# Patient Record
Sex: Male | Born: 1955 | Race: White | Hispanic: No
Health system: Northeastern US, Academic
[De-identification: ages and names within clinical notes are randomized; demographics above are authoritative.]

---

## 2020-01-01 MED ORDER — MUPIROCIN 2 % TOPICAL OINTMENT
2 | Freq: Three times a day (TID) | TOPICAL | 3 refills | 7.00000 days | Status: AC
Start: 2020-01-01 — End: 2020-01-22

## 2020-01-09 ENCOUNTER — Inpatient Hospital Stay
Admit: 2020-01-09 | Discharge: 2020-01-22 | Payer: PRIVATE HEALTH INSURANCE | Source: Ambulatory Visit | Admitting: Orthopedic Surgery

## 2020-01-10 LAB — CBC WITH AUTO DIFFERENTIAL
BKR WAM ABSOLUTE IMMATURE GRANULOCYTES: 0 x 1000/ÂµL (ref 0.0–0.3)
BKR WAM ABSOLUTE LYMPHOCYTE COUNT: 1.3 x 1000/ÂµL (ref 1.0–4.0)
BKR WAM ABSOLUTE NRBC: 0 x 1000/ÂµL (ref 0.0–0.0)
BKR WAM ANALYZER ANC: 5 x 1000/ÂµL (ref 1.0–11.0)
BKR WAM BASOPHIL ABSOLUTE COUNT: 0 x 1000/ÂµL (ref 0.0–0.0)
BKR WAM BASOPHILS: 0.4 % (ref 0.0–4.0)
BKR WAM EOSINOPHIL ABSOLUTE COUNT: 0.1 x 1000/ÂµL (ref 0.0–1.0)
BKR WAM EOSINOPHILS: 1.8 % (ref 0.0–7.0)
BKR WAM HEMATOCRIT: 33.1 % — ABNORMAL LOW (ref 37.0–52.0)
BKR WAM HEMOGLOBIN: 10.5 g/dL — ABNORMAL LOW (ref 12.0–18.0)
BKR WAM IMMATURE GRANULOCYTES: 0.1 % (ref 0.0–3.0)
BKR WAM LYMPHOCYTES: 18.6 % (ref 8.0–49.0)
BKR WAM MCH (PG): 31.1 pg — ABNORMAL HIGH (ref 27.0–31.0)
BKR WAM MCHC: 31.7 g/dL (ref 31.0–36.0)
BKR WAM MCV: 97.9 fL — ABNORMAL HIGH (ref 78.0–94.0)
BKR WAM MONOCYTE ABSOLUTE COUNT: 0.6 x 1000/ÂµL (ref 0.0–2.0)
BKR WAM MONOCYTES: 8.5 % (ref 4.0–15.0)
BKR WAM MPV: 9.5 fL (ref 6.0–11.0)
BKR WAM NEUTROPHILS: 70.6 % (ref 37.0–84.0)
BKR WAM NUCLEATED RED BLOOD CELLS: 0 % (ref 0.0–1.0)
BKR WAM PLATELETS: 156 x1000/ÂµL (ref 140–440)
BKR WAM RDW-CV: 13.2 % (ref 11.5–14.5)
BKR WAM RED BLOOD CELL COUNT: 3.4 M/ÂµL — ABNORMAL LOW (ref 3.8–5.9)
BKR WAM WHITE BLOOD CELL COUNT: 7.1 x1000/ÂµL (ref 4.0–10.0)

## 2020-01-10 LAB — BASIC METABOLIC PANEL
BKR ANION GAP: 7 (ref 7–17)
BKR BLOOD UREA NITROGEN: 25 mg/dL — ABNORMAL HIGH (ref 8–23)
BKR BUN / CREAT RATIO: 22.7 (ref 8.0–23.0)
BKR CALCIUM: 8.3 mg/dL — ABNORMAL LOW (ref 8.8–10.2)
BKR CHLORIDE: 105 mmol/L (ref 98–107)
BKR CO2: 25 mmol/L (ref 20–30)
BKR CREATININE: 1.1 mg/dL (ref 0.40–1.30)
BKR EGFR (AFR AMER): >60 mL/min/{1.73_m2} (ref >60)
BKR EGFR (NON AFRICAN AMERICAN): >60 mL/min/{1.73_m2} (ref >60)
BKR GLUCOSE: 105 mg/dL — ABNORMAL HIGH (ref 70–100)
BKR POTASSIUM: 4.8 mmol/L (ref 3.3–5.1)
BKR SODIUM: 137 mmol/L (ref 136–144)

## 2020-01-11 LAB — CBC WITH AUTO DIFFERENTIAL
BKR WAM ABSOLUTE IMMATURE GRANULOCYTES: 0 x 1000/ÂµL (ref 0.0–0.3)
BKR WAM ABSOLUTE LYMPHOCYTE COUNT: 1.3 x 1000/ÂµL (ref 1.0–4.0)
BKR WAM ABSOLUTE NRBC: 0 x 1000/ÂµL (ref 0.0–0.0)
BKR WAM ANALYZER ANC: 4.8 x 1000/ÂµL (ref 1.0–11.0)
BKR WAM BASOPHIL ABSOLUTE COUNT: 0 x 1000/ÂµL (ref 0.0–0.0)
BKR WAM BASOPHILS: 0.4 % (ref 0.0–4.0)
BKR WAM EOSINOPHIL ABSOLUTE COUNT: 0.1 x 1000/ÂµL (ref 0.0–1.0)
BKR WAM EOSINOPHILS: 1.5 % (ref 0.0–7.0)
BKR WAM HEMATOCRIT: 35.3 % — ABNORMAL LOW (ref 37.0–52.0)
BKR WAM HEMOGLOBIN: 11.2 g/dL — ABNORMAL LOW (ref 12.0–18.0)
BKR WAM IMMATURE GRANULOCYTES: 0.1 % (ref 0.0–3.0)
BKR WAM LYMPHOCYTES: 19.1 % (ref 8.0–49.0)
BKR WAM MCH (PG): 30.6 pg (ref 27.0–31.0)
BKR WAM MCHC: 31.7 g/dL (ref 31.0–36.0)
BKR WAM MCV: 96.4 fL — ABNORMAL HIGH (ref 78.0–94.0)
BKR WAM MONOCYTE ABSOLUTE COUNT: 0.6 x 1000/ÂµL (ref 0.0–2.0)
BKR WAM MONOCYTES: 8.8 % (ref 4.0–15.0)
BKR WAM MPV: 9.4 fL (ref 6.0–11.0)
BKR WAM NEUTROPHILS: 70.1 % (ref 37.0–84.0)
BKR WAM NUCLEATED RED BLOOD CELLS: 0 % (ref 0.0–1.0)
BKR WAM PLATELETS: 156 x1000/ÂµL (ref 140–440)
BKR WAM RDW-CV: 13.1 % (ref 11.5–14.5)
BKR WAM RED BLOOD CELL COUNT: 3.7 M/ÂµL — ABNORMAL LOW (ref 3.8–5.9)
BKR WAM WHITE BLOOD CELL COUNT: 6.8 x1000/ÂµL (ref 4.0–10.0)

## 2020-01-11 LAB — URINE DRUG SCREEN W/ NO CONF   (BH GH LMH YH)
BKR AMPHETAMINE SCREEN, URINE, NO CONF.: NEGATIVE
BKR BARBITURATE SCREEN, URINE, NO CONF.: NEGATIVE
BKR BENZODIAZEPINES SCREEN, URINE, NO CONF.: NEGATIVE
BKR CANNABINOIDS SCREEN, URINE, NO CONF.: NEGATIVE
BKR COCAINE SCREEN, URINE, NO CONF.: NEGATIVE
BKR METHADONE METABOLITE SCREEN, URINE, NO CONF.: POSITIVE — AB
BKR OPIATES SCREEN, URINE, NO CONF.: NEGATIVE
BKR OXYCODONE SCREEN, URINE, NO CONF.: POSITIVE — AB
BKR PHENCYCLIDINE (PCP) SCREEN, URINE, NO CONF.: NEGATIVE

## 2020-01-11 LAB — BASIC METABOLIC PANEL
BKR ANION GAP: 10 (ref 7–17)
BKR ANION GAP: 10 (ref 7–17)
BKR BLOOD UREA NITROGEN: 12 mg/dL (ref 8–23)
BKR BLOOD UREA NITROGEN: 16 mg/dL (ref 8–23)
BKR BUN / CREAT RATIO: 13.3 (ref 8.0–23.0)
BKR BUN / CREAT RATIO: 20 (ref 8.0–23.0)
BKR CALCIUM: 9.1 mg/dL (ref 8.8–10.2)
BKR CALCIUM: 9.1 mg/dL (ref 8.8–10.2)
BKR CHLORIDE: 98 mmol/L (ref 98–107)
BKR CHLORIDE: 98 mmol/L (ref 98–107)
BKR CO2: 27 mmol/L (ref 20–30)
BKR CO2: 27 mmol/L (ref 20–30)
BKR CREATININE: 0.9 mg/dL (ref 0.40–1.30)
BKR CREATININE: 0.8 mg/dL (ref 0.40–1.30)
BKR EGFR (AFR AMER): >60 mL/min/{1.73_m2} (ref >60)
BKR EGFR (AFR AMER): >60 mL/min/{1.73_m2} (ref >60)
BKR EGFR (NON AFRICAN AMERICAN): >60 mL/min/{1.73_m2} (ref >60)
BKR EGFR (NON AFRICAN AMERICAN): >60 mL/min/{1.73_m2} (ref >60)
BKR GLUCOSE: 92 mg/dL (ref 70–100)
BKR GLUCOSE: 101 mg/dL — ABNORMAL HIGH (ref 70–100)
BKR POTASSIUM: 4.3 mmol/L (ref 3.3–5.1)
BKR POTASSIUM: 4.2 mmol/L (ref 3.3–5.1)
BKR SODIUM: 135 mmol/L — ABNORMAL LOW (ref 136–144)
BKR SODIUM: 135 mmol/L — ABNORMAL LOW (ref 136–144)

## 2020-01-11 LAB — PARTIAL THROMBOPLASTIN TIME     (BH GH LMW Q YH): BKR PARTIAL THROMBOPLASTIN TIME: 24.2 s (ref 22.5–32.0)

## 2020-01-11 LAB — CBC WITHOUT DIFFERENTIAL
BKR WAM ANALYZER ANC: 5.1 x 1000/ÂµL (ref 1.0–11.0)
BKR WAM HEMATOCRIT: 34.2 % — ABNORMAL LOW (ref 37.0–52.0)
BKR WAM HEMOGLOBIN: 11.1 g/dL — ABNORMAL LOW (ref 12.0–18.0)
BKR WAM MCH (PG): 31 pg (ref 27.0–31.0)
BKR WAM MCHC: 32.5 g/dL (ref 31.0–36.0)
BKR WAM MCV: 95.5 fL — ABNORMAL HIGH (ref 78.0–94.0)
BKR WAM MPV: 9.4 fL (ref 6.0–11.0)
BKR WAM PLATELETS: 154 x1000/ÂµL (ref 140–440)
BKR WAM RDW-CV: 13 % (ref 11.5–14.5)
BKR WAM RED BLOOD CELL COUNT: 3.6 M/ÂµL — ABNORMAL LOW (ref 3.8–5.9)
BKR WAM WHITE BLOOD CELL COUNT: 6.6 x1000/ÂµL (ref 4.0–10.0)

## 2020-01-11 LAB — FIBRINOGEN     (BH GH L LMW YH): BKR FIBRINOGEN LEVEL: 508 mg/dL — ABNORMAL HIGH (ref 209–444)

## 2020-01-11 LAB — PROTIME AND INR
BKR INR: 0.95 (ref 0.90–1.09)
BKR PROTHROMBIN TIME: 10.6 s (ref 10.2–12.2)

## 2020-01-11 LAB — ZZZTROPONIN T     (Q)
BKR TROPONIN T: <0.01 ng/mL (ref <0.01)
BKR TROPONIN T: <0.01 ng/mL (ref <0.01)

## 2020-01-12 LAB — COMPREHENSIVE METABOLIC PANEL
BKR A/G RATIO: 1.3 (ref 1.0–2.2)
BKR ALANINE AMINOTRANSFERASE (ALT): 18 U/L (ref 9–59)
BKR ALBUMIN: 3.8 g/dL (ref 3.6–4.9)
BKR ALKALINE PHOSPHATASE: 86 U/L (ref 9–122)
BKR ANION GAP: 11 (ref 7–17)
BKR ASPARTATE AMINOTRANSFERASE (AST): 21 U/L (ref 10–35)
BKR AST/ALT RATIO: 1.2 (ref 0.3–4.9)
BKR BILIRUBIN TOTAL: 0.6 mg/dL (ref <=1.2)
BKR BLOOD UREA NITROGEN: 17 mg/dL (ref 8–23)
BKR BUN / CREAT RATIO: 21.5 (ref 8.0–23.0)
BKR CALCIUM: 9.2 mg/dL (ref 8.8–10.2)
BKR CHLORIDE: 101 mmol/L (ref 98–107)
BKR CO2: 25 mmol/L (ref 20–30)
BKR CREATININE: 0.79 mg/dL (ref 0.40–1.30)
BKR EGFR (AFR AMER): >60 mL/min/{1.73_m2} (ref >60)
BKR EGFR (NON AFRICAN AMERICAN): >60 mL/min/{1.73_m2} (ref >60)
BKR GLOBULIN: 2.9 g/dL (ref 2.3–3.5)
BKR GLUCOSE: 105 mg/dL — ABNORMAL HIGH (ref 70–100)
BKR POTASSIUM: 4.2 mmol/L (ref 3.3–5.1)
BKR PROTEIN TOTAL: 6.7 g/dL (ref 6.6–8.7)
BKR SODIUM: 137 mmol/L (ref 136–144)

## 2020-01-12 LAB — CBC WITH AUTO DIFFERENTIAL
BKR WAM ABSOLUTE IMMATURE GRANULOCYTES: 0 x 1000/ÂµL (ref 0.0–0.3)
BKR WAM ABSOLUTE LYMPHOCYTE COUNT: 1.3 x 1000/ÂµL (ref 1.0–4.0)
BKR WAM ABSOLUTE NRBC: 0 x 1000/ÂµL (ref 0.0–0.0)
BKR WAM ANALYZER ANC: 5.4 x 1000/ÂµL (ref 1.0–11.0)
BKR WAM BASOPHIL ABSOLUTE COUNT: 0 x 1000/ÂµL (ref 0.0–0.0)
BKR WAM BASOPHILS: 0.3 % (ref 0.0–4.0)
BKR WAM EOSINOPHIL ABSOLUTE COUNT: 0.1 x 1000/ÂµL (ref 0.0–1.0)
BKR WAM EOSINOPHILS: 1.5 % (ref 0.0–7.0)
BKR WAM HEMATOCRIT: 34 % — ABNORMAL LOW (ref 37.0–52.0)
BKR WAM HEMOGLOBIN: 11 g/dL — ABNORMAL LOW (ref 12.0–18.0)
BKR WAM IMMATURE GRANULOCYTES: 0.3 % (ref 0.0–3.0)
BKR WAM LYMPHOCYTES: 17.4 % (ref 8.0–49.0)
BKR WAM MCH (PG): 30.9 pg (ref 27.0–31.0)
BKR WAM MCHC: 32.4 g/dL (ref 31.0–36.0)
BKR WAM MCV: 95.5 fL — ABNORMAL HIGH (ref 78.0–94.0)
BKR WAM MONOCYTE ABSOLUTE COUNT: 0.6 x 1000/ÂµL (ref 0.0–2.0)
BKR WAM MONOCYTES: 7.7 % (ref 4.0–15.0)
BKR WAM MPV: 9.8 fL (ref 6.0–11.0)
BKR WAM NEUTROPHILS: 72.8 % (ref 37.0–84.0)
BKR WAM NUCLEATED RED BLOOD CELLS: 0 % (ref 0.0–1.0)
BKR WAM PLATELETS: 162 x1000/ÂµL (ref 140–440)
BKR WAM RDW-CV: 12.7 % (ref 11.5–14.5)
BKR WAM RED BLOOD CELL COUNT: 3.6 M/ÂµL — ABNORMAL LOW (ref 3.8–5.9)
BKR WAM WHITE BLOOD CELL COUNT: 7.4 x1000/ÂµL (ref 4.0–10.0)

## 2020-01-12 LAB — LDL CHOLESTEROL, DIRECT: BKR LDL CHOLESTEROL DIRECT: 80 mg/dL

## 2020-01-12 LAB — LIPID PANEL
BKR CHOLESTEROL/HDL RATIO: 2.3 (ref 0.0–5.0)
BKR CHOLESTEROL: 165 mg/dL
BKR HDL CHOLESTEROL: 71 mg/dL (ref >=40)
BKR LDL CHOLESTEROL CALCULATED: 72 mg/dL
BKR TRIGLYCERIDES: 112 mg/dL

## 2020-01-12 LAB — T4, FREE: BKR FREE T4: 1.49 ng/dL

## 2020-01-12 LAB — PHOSPHORUS     (BH GH L LMW YH): BKR PHOSPHORUS: 4.1 mg/dL (ref 2.2–4.5)

## 2020-01-12 LAB — MAGNESIUM: BKR MAGNESIUM: 2.2 mg/dL (ref 1.7–2.4)

## 2020-01-12 LAB — TSH W/REFLEX TO FT4     (BH GH LMW Q YH): BKR THYROID STIMULATING HORMONE: 0.136 u[IU]/mL — ABNORMAL LOW

## 2020-01-13 LAB — CBC WITH AUTO DIFFERENTIAL
BKR WAM ABSOLUTE IMMATURE GRANULOCYTES: 0 x 1000/ÂµL (ref 0.0–0.3)
BKR WAM ABSOLUTE LYMPHOCYTE COUNT: 1.1 x 1000/ÂµL (ref 1.0–4.0)
BKR WAM ABSOLUTE NRBC: 0 x 1000/ÂµL (ref 0.0–0.0)
BKR WAM ANALYZER ANC: 5 x 1000/ÂµL (ref 1.0–11.0)
BKR WAM BASOPHIL ABSOLUTE COUNT: 0 x 1000/ÂµL (ref 0.0–0.0)
BKR WAM BASOPHILS: 0.4 % (ref 0.0–4.0)
BKR WAM EOSINOPHIL ABSOLUTE COUNT: 0.2 x 1000/ÂµL (ref 0.0–1.0)
BKR WAM EOSINOPHILS: 2.6 % (ref 0.0–7.0)
BKR WAM HEMATOCRIT: 33.5 % — ABNORMAL LOW (ref 37.0–52.0)
BKR WAM HEMOGLOBIN: 10.9 g/dL — ABNORMAL LOW (ref 12.0–18.0)
BKR WAM IMMATURE GRANULOCYTES: 0.3 % (ref 0.0–3.0)
BKR WAM LYMPHOCYTES: 16 % (ref 8.0–49.0)
BKR WAM MCH (PG): 31.2 pg — ABNORMAL HIGH (ref 27.0–31.0)
BKR WAM MCHC: 32.5 g/dL (ref 31.0–36.0)
BKR WAM MCV: 96 fL — ABNORMAL HIGH (ref 78.0–94.0)
BKR WAM MONOCYTE ABSOLUTE COUNT: 0.5 x 1000/ÂµL (ref 0.0–2.0)
BKR WAM MONOCYTES: 7.8 % (ref 4.0–15.0)
BKR WAM MPV: 10 fL (ref 6.0–11.0)
BKR WAM NEUTROPHILS: 72.9 % (ref 37.0–84.0)
BKR WAM NUCLEATED RED BLOOD CELLS: 0 % (ref 0.0–1.0)
BKR WAM PLATELETS: 174 x1000/ÂµL (ref 140–440)
BKR WAM RDW-CV: 12.9 % (ref 11.5–14.5)
BKR WAM RED BLOOD CELL COUNT: 3.5 M/ÂµL — ABNORMAL LOW (ref 3.8–5.9)
BKR WAM WHITE BLOOD CELL COUNT: 6.8 x1000/ÂµL (ref 4.0–10.0)

## 2020-01-13 LAB — ZZZTROPONIN T     (Q): BKR TROPONIN T: <0.01 ng/mL (ref <0.01)

## 2020-01-13 LAB — BASIC METABOLIC PANEL
BKR ANION GAP: 11 (ref 7–17)
BKR BLOOD UREA NITROGEN: 28 mg/dL — ABNORMAL HIGH (ref 8–23)
BKR BUN / CREAT RATIO: 32.2 — ABNORMAL HIGH (ref 8.0–23.0)
BKR CALCIUM: 9 mg/dL (ref 8.8–10.2)
BKR CHLORIDE: 100 mmol/L (ref 98–107)
BKR CO2: 27 mmol/L (ref 20–30)
BKR CREATININE: 0.87 mg/dL (ref 0.40–1.30)
BKR EGFR (AFR AMER): >60 mL/min/{1.73_m2} (ref >60)
BKR EGFR (NON AFRICAN AMERICAN): >60 mL/min/{1.73_m2} (ref >60)
BKR GLUCOSE: 129 mg/dL — ABNORMAL HIGH (ref 70–100)
BKR POTASSIUM: 4.2 mmol/L (ref 3.3–5.1)
BKR SODIUM: 138 mmol/L (ref 136–144)

## 2020-01-13 LAB — NT-PROBNPE: BKR B-TYPE NATRIURETIC PEPTIDE, PRO (PROBNP): 50 pg/mL (ref <125.0)

## 2020-01-13 LAB — MAGNESIUM: BKR MAGNESIUM: 2.4 mg/dL (ref 1.7–2.4)

## 2020-01-13 LAB — PHOSPHORUS     (BH GH L LMW YH): BKR PHOSPHORUS: 3.8 mg/dL (ref 2.2–4.5)

## 2020-01-14 LAB — CBC WITH AUTO DIFFERENTIAL
BKR WAM ABSOLUTE IMMATURE GRANULOCYTES: 0 x 1000/ÂµL (ref 0.0–0.3)
BKR WAM ABSOLUTE LYMPHOCYTE COUNT: 1.2 x 1000/ÂµL (ref 1.0–4.0)
BKR WAM ABSOLUTE NRBC: 0 x 1000/ÂµL (ref 0.0–0.0)
BKR WAM ANALYZER ANC: 3.3 x 1000/ÂµL (ref 1.0–11.0)
BKR WAM BASOPHIL ABSOLUTE COUNT: 0 x 1000/ÂµL (ref 0.0–0.0)
BKR WAM BASOPHILS: 0.6 % (ref 0.0–4.0)
BKR WAM EOSINOPHIL ABSOLUTE COUNT: 0.2 x 1000/ÂµL (ref 0.0–1.0)
BKR WAM EOSINOPHILS: 3.5 % (ref 0.0–7.0)
BKR WAM HEMATOCRIT: 33.1 % — ABNORMAL LOW (ref 37.0–52.0)
BKR WAM HEMOGLOBIN: 10.6 g/dL — ABNORMAL LOW (ref 12.0–18.0)
BKR WAM IMMATURE GRANULOCYTES: 0.4 % (ref 0.0–3.0)
BKR WAM LYMPHOCYTES: 22.7 % (ref 8.0–49.0)
BKR WAM MCH (PG): 31.7 pg — ABNORMAL HIGH (ref 27.0–31.0)
BKR WAM MCHC: 32 g/dL (ref 31.0–36.0)
BKR WAM MCV: 99.1 fL — ABNORMAL HIGH (ref 78.0–94.0)
BKR WAM MONOCYTE ABSOLUTE COUNT: 0.4 x 1000/ÂµL (ref 0.0–2.0)
BKR WAM MONOCYTES: 8.4 % (ref 4.0–15.0)
BKR WAM MPV: 10.1 fL (ref 6.0–11.0)
BKR WAM NEUTROPHILS: 64.4 % (ref 37.0–84.0)
BKR WAM NUCLEATED RED BLOOD CELLS: 0 % (ref 0.0–1.0)
BKR WAM PLATELETS: 179 x1000/ÂµL (ref 140–440)
BKR WAM RDW-CV: 12.9 % (ref 11.5–14.5)
BKR WAM RED BLOOD CELL COUNT: 3.3 M/ÂµL — ABNORMAL LOW (ref 3.8–5.9)
BKR WAM WHITE BLOOD CELL COUNT: 5.1 x1000/ÂµL (ref 4.0–10.0)

## 2020-01-14 LAB — BASIC METABOLIC PANEL
BKR ANION GAP: 9 (ref 7–17)
BKR BLOOD UREA NITROGEN: 25 mg/dL — ABNORMAL HIGH (ref 8–23)
BKR BUN / CREAT RATIO: 28.4 — ABNORMAL HIGH (ref 8.0–23.0)
BKR CALCIUM: 8.9 mg/dL (ref 8.8–10.2)
BKR CHLORIDE: 103 mmol/L (ref 98–107)
BKR CO2: 27 mmol/L (ref 20–30)
BKR CREATININE: 0.88 mg/dL (ref 0.40–1.30)
BKR EGFR (AFR AMER): >60 mL/min/{1.73_m2} (ref >60)
BKR EGFR (NON AFRICAN AMERICAN): >60 mL/min/{1.73_m2} (ref >60)
BKR GLUCOSE: 98 mg/dL (ref 70–100)
BKR POTASSIUM: 4.1 mmol/L (ref 3.3–5.1)
BKR SODIUM: 139 mmol/L (ref 136–144)

## 2020-01-14 LAB — MAGNESIUM: BKR MAGNESIUM: 2.1 mg/dL (ref 1.7–2.4)

## 2020-01-14 LAB — PHOSPHORUS     (BH GH L LMW YH): BKR PHOSPHORUS: 3.8 mg/dL (ref 2.2–4.5)

## 2020-01-15 LAB — COVID-19 CLEARANCE OR FOR PLACEMENT ONLY: BKR SARS-COV-2 RNA (COVID-19) (YH): NEGATIVE

## 2020-01-15 LAB — CBC WITH AUTO DIFFERENTIAL
BKR WAM ABSOLUTE IMMATURE GRANULOCYTES: 0 x 1000/ÂµL (ref 0.0–0.3)
BKR WAM ABSOLUTE LYMPHOCYTE COUNT: 1.3 x 1000/ÂµL (ref 1.0–4.0)
BKR WAM ABSOLUTE NRBC: 0 x 1000/ÂµL (ref 0.0–0.0)
BKR WAM ANALYZER ANC: 3.2 x 1000/ÂµL (ref 1.0–11.0)
BKR WAM BASOPHIL ABSOLUTE COUNT: 0 x 1000/ÂµL (ref 0.0–0.0)
BKR WAM BASOPHILS: 0.4 % (ref 0.0–4.0)
BKR WAM EOSINOPHIL ABSOLUTE COUNT: 0.2 x 1000/ÂµL (ref 0.0–1.0)
BKR WAM EOSINOPHILS: 3.3 % (ref 0.0–7.0)
BKR WAM HEMATOCRIT: 33 % — ABNORMAL LOW (ref 37.0–52.0)
BKR WAM HEMOGLOBIN: 10.7 g/dL — ABNORMAL LOW (ref 12.0–18.0)
BKR WAM IMMATURE GRANULOCYTES: 0.2 % (ref 0.0–3.0)
BKR WAM LYMPHOCYTES: 25.3 % (ref 8.0–49.0)
BKR WAM MCH (PG): 31.4 pg — ABNORMAL HIGH (ref 27.0–31.0)
BKR WAM MCHC: 32.4 g/dL (ref 31.0–36.0)
BKR WAM MCV: 96.8 fL — ABNORMAL HIGH (ref 78.0–94.0)
BKR WAM MONOCYTE ABSOLUTE COUNT: 0.5 x 1000/ÂµL (ref 0.0–2.0)
BKR WAM MONOCYTES: 8.8 % (ref 4.0–15.0)
BKR WAM MPV: 9.9 fL (ref 6.0–11.0)
BKR WAM NEUTROPHILS: 62 % (ref 37.0–84.0)
BKR WAM NUCLEATED RED BLOOD CELLS: 0 % (ref 0.0–1.0)
BKR WAM PLATELETS: 199 x1000/ÂµL (ref 140–440)
BKR WAM RDW-CV: 12.8 % (ref 11.5–14.5)
BKR WAM RED BLOOD CELL COUNT: 3.4 M/ÂµL — ABNORMAL LOW (ref 3.8–5.9)
BKR WAM WHITE BLOOD CELL COUNT: 5.1 x1000/ÂµL (ref 4.0–10.0)

## 2020-01-15 LAB — PHOSPHORUS     (BH GH L LMW YH): BKR PHOSPHORUS: 3.9 mg/dL (ref 2.2–4.5)

## 2020-01-15 LAB — BASIC METABOLIC PANEL
BKR ANION GAP: 10 (ref 7–17)
BKR BLOOD UREA NITROGEN: 20 mg/dL (ref 8–23)
BKR BUN / CREAT RATIO: 26.3 — ABNORMAL HIGH (ref 8.0–23.0)
BKR CALCIUM: 8.8 mg/dL (ref 8.8–10.2)
BKR CHLORIDE: 103 mmol/L (ref 98–107)
BKR CO2: 26 mmol/L (ref 20–30)
BKR CREATININE: 0.76 mg/dL (ref 0.40–1.30)
BKR EGFR (AFR AMER): >60 mL/min/{1.73_m2} (ref >60)
BKR EGFR (NON AFRICAN AMERICAN): >60 mL/min/{1.73_m2} (ref >60)
BKR GLUCOSE: 118 mg/dL — ABNORMAL HIGH (ref 70–100)
BKR POTASSIUM: 4.1 mmol/L (ref 3.3–5.1)
BKR SODIUM: 139 mmol/L (ref 136–144)

## 2020-01-15 LAB — MAGNESIUM: BKR MAGNESIUM: 2.2 mg/dL (ref 1.7–2.4)

## 2020-01-15 LAB — DEEP WOUND CULTURE   (BH GH LMW YH)
BKR DEEP WOUND CULTURE: NO GROWTH
BKR GRAM STAIN (ROUTINE): NONE SEEN

## 2020-01-16 LAB — PT/INR AND PTT (BH GH L LMW YH)
BKR INR: 0.99 (ref 0.88–1.15)
BKR PARTIAL THROMBOPLASTIN TIME: 22.6 s — ABNORMAL LOW (ref 23.9–29.9)
BKR PROTHROMBIN TIME: 10.7 s (ref 9.6–12.3)

## 2020-01-16 LAB — BASIC METABOLIC PANEL
BKR ANION GAP: 13 (ref 7–17)
BKR BLOOD UREA NITROGEN: 29 mg/dL — ABNORMAL HIGH (ref 8–23)
BKR BUN / CREAT RATIO: 33.7 — ABNORMAL HIGH (ref 8.0–23.0)
BKR CALCIUM: 9.1 mg/dL (ref 8.8–10.2)
BKR CHLORIDE: 101 mmol/L (ref 98–107)
BKR CO2: 23 mmol/L (ref 20–30)
BKR CREATININE: 0.86 mg/dL (ref 0.40–1.30)
BKR EGFR (AFR AMER): >60 mL/min/{1.73_m2} (ref >60)
BKR EGFR (NON AFRICAN AMERICAN): >60 mL/min/{1.73_m2} (ref >60)
BKR GLUCOSE: 143 mg/dL — ABNORMAL HIGH (ref 70–100)
BKR POTASSIUM: 4.4 mmol/L (ref 3.3–5.1)
BKR SODIUM: 137 mmol/L (ref 136–144)

## 2020-01-16 LAB — CBC WITH AUTO DIFFERENTIAL
BKR WAM ABSOLUTE IMMATURE GRANULOCYTES: 0 x 1000/ÂµL (ref 0.0–0.3)
BKR WAM ABSOLUTE LYMPHOCYTE COUNT: 1.3 x 1000/ÂµL (ref 1.0–4.0)
BKR WAM ABSOLUTE NRBC: 0 x 1000/ÂµL (ref 0.0–0.0)
BKR WAM ANALYZER ANC: 4.9 x 1000/ÂµL (ref 1.0–11.0)
BKR WAM BASOPHIL ABSOLUTE COUNT: 0 x 1000/ÂµL (ref 0.0–0.0)
BKR WAM BASOPHILS: 0.4 % (ref 0.0–4.0)
BKR WAM EOSINOPHIL ABSOLUTE COUNT: 0.2 x 1000/ÂµL (ref 0.0–1.0)
BKR WAM EOSINOPHILS: 3 % (ref 0.0–7.0)
BKR WAM HEMATOCRIT: 32.6 % — ABNORMAL LOW (ref 37.0–52.0)
BKR WAM HEMOGLOBIN: 10.6 g/dL — ABNORMAL LOW (ref 12.0–18.0)
BKR WAM IMMATURE GRANULOCYTES: 0.1 % (ref 0.0–3.0)
BKR WAM LYMPHOCYTES: 18.5 % (ref 8.0–49.0)
BKR WAM MCH (PG): 30.6 pg (ref 27.0–31.0)
BKR WAM MCHC: 32.5 g/dL (ref 31.0–36.0)
BKR WAM MCV: 94.2 fL — ABNORMAL HIGH (ref 78.0–94.0)
BKR WAM MONOCYTE ABSOLUTE COUNT: 0.5 x 1000/ÂµL (ref 0.0–2.0)
BKR WAM MONOCYTES: 7.5 % (ref 4.0–15.0)
BKR WAM MPV: 10.1 fL (ref 6.0–11.0)
BKR WAM NEUTROPHILS: 70.5 % (ref 37.0–84.0)
BKR WAM NUCLEATED RED BLOOD CELLS: 0 % (ref 0.0–1.0)
BKR WAM PLATELETS: 229 x1000/ÂµL (ref 140–440)
BKR WAM RDW-CV: 12.6 % (ref 11.5–14.5)
BKR WAM RED BLOOD CELL COUNT: 3.5 M/ÂµL — ABNORMAL LOW (ref 3.8–5.9)
BKR WAM WHITE BLOOD CELL COUNT: 6.9 x1000/ÂµL (ref 4.0–10.0)

## 2020-01-16 LAB — URINALYSIS-MACROSCOPIC W/REFLEX MICROSCOPIC
BKR BILIRUBIN, UA: NEGATIVE
BKR BLOOD, UA: NEGATIVE
BKR GLUCOSE, UA: NEGATIVE
BKR KETONES, UA: NEGATIVE
BKR LEUKOCYTE ESTERASE, UA: NEGATIVE
BKR NITRITE, UA: NEGATIVE
BKR PH, UA: 6 (ref 5.5–7.5)
BKR SPECIFIC GRAVITY, UA: 1.032 — ABNORMAL HIGH (ref 1.005–1.030)
BKR UROBILINOGEN, UA: <2 EU/dL (ref <=2.0)

## 2020-01-16 LAB — PHOSPHORUS     (BH GH L LMW YH): BKR PHOSPHORUS: 3.9 mg/dL (ref 2.2–4.5)

## 2020-01-16 LAB — MAGNESIUM: BKR MAGNESIUM: 2.3 mg/dL (ref 1.7–2.4)

## 2020-01-17 LAB — CBC WITH AUTO DIFFERENTIAL
BKR WAM ABSOLUTE IMMATURE GRANULOCYTES: 0 x 1000/ÂµL (ref 0.0–0.3)
BKR WAM ABSOLUTE LYMPHOCYTE COUNT: 1.2 x 1000/ÂµL (ref 1.0–4.0)
BKR WAM ABSOLUTE NRBC: 0 x 1000/ÂµL (ref 0.0–0.0)
BKR WAM ANALYZER ANC: 4 x 1000/ÂµL (ref 1.0–11.0)
BKR WAM BASOPHIL ABSOLUTE COUNT: 0 x 1000/ÂµL (ref 0.0–0.0)
BKR WAM BASOPHILS: 0.5 % (ref 0.0–4.0)
BKR WAM EOSINOPHIL ABSOLUTE COUNT: 0.1 x 1000/ÂµL (ref 0.0–1.0)
BKR WAM EOSINOPHILS: 2.2 % (ref 0.0–7.0)
BKR WAM HEMATOCRIT: 26.7 % — ABNORMAL LOW (ref 37.0–52.0)
BKR WAM HEMOGLOBIN: 8.6 g/dL — ABNORMAL LOW (ref 12.0–18.0)
BKR WAM IMMATURE GRANULOCYTES: 0.2 % (ref 0.0–3.0)
BKR WAM LYMPHOCYTES: 20.7 % (ref 8.0–49.0)
BKR WAM MCH (PG): 31.3 pg — ABNORMAL HIGH (ref 27.0–31.0)
BKR WAM MCHC: 32.2 g/dL (ref 31.0–36.0)
BKR WAM MCV: 97.1 fL — ABNORMAL HIGH (ref 78.0–94.0)
BKR WAM MONOCYTE ABSOLUTE COUNT: 0.5 x 1000/ÂµL (ref 0.0–2.0)
BKR WAM MONOCYTES: 8.8 % (ref 4.0–15.0)
BKR WAM MPV: 9.6 fL (ref 6.0–11.0)
BKR WAM NEUTROPHILS: 67.6 % (ref 37.0–84.0)
BKR WAM NUCLEATED RED BLOOD CELLS: 0 % (ref 0.0–1.0)
BKR WAM PLATELETS: 169 x1000/ÂµL (ref 140–440)
BKR WAM RDW-CV: 13.1 % (ref 11.5–14.5)
BKR WAM RED BLOOD CELL COUNT: 2.8 M/ÂµL — ABNORMAL LOW (ref 3.8–5.9)
BKR WAM WHITE BLOOD CELL COUNT: 5.9 x1000/ÂµL (ref 4.0–10.0)

## 2020-01-17 LAB — BASIC METABOLIC PANEL
BKR ANION GAP: 9 (ref 7–17)
BKR BLOOD UREA NITROGEN: 20 mg/dL (ref 8–23)
BKR BUN / CREAT RATIO: 23 (ref 8.0–23.0)
BKR CALCIUM: 8.2 mg/dL — ABNORMAL LOW (ref 8.8–10.2)
BKR CHLORIDE: 105 mmol/L (ref 98–107)
BKR CO2: 25 mmol/L (ref 20–30)
BKR CREATININE: 0.87 mg/dL (ref 0.40–1.30)
BKR EGFR (AFR AMER): >60 mL/min/{1.73_m2} (ref >60)
BKR EGFR (NON AFRICAN AMERICAN): >60 mL/min/{1.73_m2} (ref >60)
BKR GLUCOSE: 129 mg/dL — ABNORMAL HIGH (ref 70–100)
BKR POTASSIUM: 4.2 mmol/L (ref 3.3–5.1)
BKR SODIUM: 139 mmol/L (ref 136–144)

## 2020-01-17 LAB — ALKALINE PHOSPHATASE: BKR ALKALINE PHOSPHATASE: 74 U/L (ref 9–122)

## 2020-01-17 LAB — AST: BKR ASPARTATE AMINOTRANSFERASE (AST): 37 U/L — ABNORMAL HIGH (ref 10–35)

## 2020-01-17 LAB — MAGNESIUM: BKR MAGNESIUM: 2.1 mg/dL (ref 1.7–2.4)

## 2020-01-17 LAB — PHOSPHORUS     (BH GH L LMW YH): BKR PHOSPHORUS: 4 mg/dL (ref 2.2–4.5)

## 2020-01-17 LAB — ALT: BKR ALANINE AMINOTRANSFERASE (ALT): 28 U/L (ref 9–59)

## 2020-01-18 LAB — CBC WITH AUTO DIFFERENTIAL
BKR WAM ABSOLUTE IMMATURE GRANULOCYTES: 0 x 1000/ÂµL (ref 0.0–0.3)
BKR WAM ABSOLUTE LYMPHOCYTE COUNT: 1.2 x 1000/ÂµL (ref 1.0–4.0)
BKR WAM ABSOLUTE NRBC: 0 x 1000/ÂµL (ref 0.0–0.0)
BKR WAM ANALYZER ANC: 2.8 x 1000/ÂµL (ref 1.0–11.0)
BKR WAM BASOPHIL ABSOLUTE COUNT: 0 x 1000/ÂµL (ref 0.0–0.0)
BKR WAM BASOPHILS: 0.6 % (ref 0.0–4.0)
BKR WAM EOSINOPHIL ABSOLUTE COUNT: 0.2 x 1000/ÂµL (ref 0.0–1.0)
BKR WAM EOSINOPHILS: 3.2 % (ref 0.0–7.0)
BKR WAM HEMATOCRIT: 26.6 % — ABNORMAL LOW (ref 37.0–52.0)
BKR WAM HEMOGLOBIN: 8.5 g/dL — ABNORMAL LOW (ref 12.0–18.0)
BKR WAM IMMATURE GRANULOCYTES: 0.4 % (ref 0.0–3.0)
BKR WAM LYMPHOCYTES: 25.9 % (ref 8.0–49.0)
BKR WAM MCH (PG): 32 pg — ABNORMAL HIGH (ref 27.0–31.0)
BKR WAM MCHC: 32 g/dL (ref 31.0–36.0)
BKR WAM MCV: 100 fL — ABNORMAL HIGH (ref 78.0–94.0)
BKR WAM MONOCYTE ABSOLUTE COUNT: 0.4 x 1000/ÂµL (ref 0.0–2.0)
BKR WAM MONOCYTES: 9.1 % (ref 4.0–15.0)
BKR WAM MPV: 9.4 fL (ref 6.0–11.0)
BKR WAM NEUTROPHILS: 60.8 % (ref 37.0–84.0)
BKR WAM NUCLEATED RED BLOOD CELLS: 0 % (ref 0.0–1.0)
BKR WAM PLATELETS: 180 x1000/ÂµL (ref 140–440)
BKR WAM RDW-CV: 12.8 % (ref 11.5–14.5)
BKR WAM RED BLOOD CELL COUNT: 2.7 M/ÂµL — ABNORMAL LOW (ref 3.8–5.9)
BKR WAM WHITE BLOOD CELL COUNT: 4.6 x1000/ÂµL (ref 4.0–10.0)

## 2020-01-18 LAB — PHOSPHORUS     (BH GH L LMW YH): BKR PHOSPHORUS: 3.2 mg/dL (ref 2.2–4.5)

## 2020-01-18 LAB — BASIC METABOLIC PANEL
BKR ANION GAP: 6 — ABNORMAL LOW (ref 7–17)
BKR BLOOD UREA NITROGEN: 18 mg/dL (ref 8–23)
BKR BUN / CREAT RATIO: 22.8 (ref 8.0–23.0)
BKR CALCIUM: 8.4 mg/dL — ABNORMAL LOW (ref 8.8–10.2)
BKR CHLORIDE: 104 mmol/L (ref 98–107)
BKR CO2: 28 mmol/L (ref 20–30)
BKR CREATININE: 0.79 mg/dL (ref 0.40–1.30)
BKR EGFR (AFR AMER): >60 mL/min/{1.73_m2} (ref >60)
BKR EGFR (NON AFRICAN AMERICAN): >60 mL/min/{1.73_m2} (ref >60)
BKR GLUCOSE: 121 mg/dL — ABNORMAL HIGH (ref 70–100)
BKR POTASSIUM: 4.1 mmol/L (ref 3.3–5.1)
BKR SODIUM: 138 mmol/L (ref 136–144)

## 2020-01-18 LAB — CALCIUM: BKR CALCIUM: 8.4 mg/dL — ABNORMAL LOW (ref 8.8–10.2)

## 2020-01-18 LAB — MAGNESIUM: BKR MAGNESIUM: 2.3 mg/dL (ref 1.7–2.4)

## 2020-01-19 LAB — CALCIUM: BKR CALCIUM: 8.7 mg/dL — ABNORMAL LOW (ref 8.8–10.2)

## 2020-01-19 LAB — BASIC METABOLIC PANEL
BKR ANION GAP: 10 (ref 7–17)
BKR BLOOD UREA NITROGEN: 23 mg/dL (ref 8–23)
BKR BUN / CREAT RATIO: 28.8 — ABNORMAL HIGH (ref 8.0–23.0)
BKR CALCIUM: 8.7 mg/dL — ABNORMAL LOW (ref 8.8–10.2)
BKR CHLORIDE: 106 mmol/L (ref 98–107)
BKR CO2: 27 mmol/L (ref 20–30)
BKR CREATININE: 0.8 mg/dL (ref 0.40–1.30)
BKR EGFR (AFR AMER): >60 mL/min/{1.73_m2} (ref >60)
BKR EGFR (NON AFRICAN AMERICAN): >60 mL/min/{1.73_m2} (ref >60)
BKR GLUCOSE: 117 mg/dL — ABNORMAL HIGH (ref 70–100)
BKR POTASSIUM: 4.3 mmol/L (ref 3.3–5.1)
BKR SODIUM: 143 mmol/L (ref 136–144)

## 2020-01-19 LAB — CBC WITH AUTO DIFFERENTIAL
BKR WAM ABSOLUTE IMMATURE GRANULOCYTES: 0 x 1000/ÂµL (ref 0.0–0.3)
BKR WAM ABSOLUTE LYMPHOCYTE COUNT: 1.4 x 1000/ÂµL (ref 1.0–4.0)
BKR WAM ABSOLUTE NRBC: 0 x 1000/ÂµL (ref 0.0–0.0)
BKR WAM ANALYZER ANC: 3.2 x 1000/ÂµL (ref 1.0–11.0)
BKR WAM BASOPHIL ABSOLUTE COUNT: 0 x 1000/ÂµL (ref 0.0–0.0)
BKR WAM BASOPHILS: 0.4 % (ref 0.0–4.0)
BKR WAM EOSINOPHIL ABSOLUTE COUNT: 0.2 x 1000/ÂµL (ref 0.0–1.0)
BKR WAM EOSINOPHILS: 2.9 % (ref 0.0–7.0)
BKR WAM HEMATOCRIT: 25.2 % — ABNORMAL LOW (ref 37.0–52.0)
BKR WAM HEMOGLOBIN: 8.1 g/dL — ABNORMAL LOW (ref 12.0–18.0)
BKR WAM IMMATURE GRANULOCYTES: 0.2 % (ref 0.0–3.0)
BKR WAM LYMPHOCYTES: 27.1 % (ref 8.0–49.0)
BKR WAM MCH (PG): 31.3 pg — ABNORMAL HIGH (ref 27.0–31.0)
BKR WAM MCHC: 32.1 g/dL (ref 31.0–36.0)
BKR WAM MCV: 97.3 fL — ABNORMAL HIGH (ref 78.0–94.0)
BKR WAM MONOCYTE ABSOLUTE COUNT: 0.4 x 1000/ÂµL (ref 0.0–2.0)
BKR WAM MONOCYTES: 7.6 % (ref 4.0–15.0)
BKR WAM MPV: 9.6 fL (ref 6.0–11.0)
BKR WAM NEUTROPHILS: 61.8 % (ref 37.0–84.0)
BKR WAM NUCLEATED RED BLOOD CELLS: 0 % (ref 0.0–1.0)
BKR WAM PLATELETS: 194 x1000/ÂµL (ref 140–440)
BKR WAM RDW-CV: 12.8 % (ref 11.5–14.5)
BKR WAM RED BLOOD CELL COUNT: 2.6 M/ÂµL — ABNORMAL LOW (ref 3.8–5.9)
BKR WAM WHITE BLOOD CELL COUNT: 5.1 x1000/ÂµL (ref 4.0–10.0)

## 2020-01-19 LAB — MAGNESIUM: BKR MAGNESIUM: 2.1 mg/dL (ref 1.7–2.4)

## 2020-01-19 LAB — PHOSPHORUS     (BH GH L LMW YH): BKR PHOSPHORUS: 3.5 mg/dL (ref 2.2–4.5)

## 2020-01-20 LAB — LIVER FUNCTION TESTS     (YH)
BKR ALANINE AMINOTRANSFERASE (ALT): 38 U/L (ref 9–59)
BKR ALKALINE PHOSPHATASE: 92 U/L (ref 9–122)
BKR ASPARTATE AMINOTRANSFERASE (AST): 38 U/L — ABNORMAL HIGH (ref 10–35)
BKR AST/ALT RATIO: 1 (ref 0.3–4.9)
BKR BILIRUBIN DIRECT: <0.2 mg/dL (ref <=0.3)
BKR BILIRUBIN TOTAL: <0.2 mg/dL (ref <=1.2)

## 2020-01-20 MED ADMIN — OXYCODONE IMMEDIATE RELEASE 5 MG TABLET: 5 mg | ORAL | @ 21:00:00 | Stop: 2020-01-22

## 2020-01-20 MED ADMIN — METOPROLOL TARTRATE IMMEDIATE RELEASE 50 MG TABLET: 50 mg | ORAL | @ 14:00:00 | Stop: 2020-01-22

## 2020-01-20 MED ADMIN — OXYCODONE IMMEDIATE RELEASE 5 MG TABLET: 5 mg | ORAL | @ 14:00:00 | Stop: 2020-01-22

## 2020-01-20 MED ADMIN — OXYCODONE IMMEDIATE RELEASE 5 MG TABLET: 5 mg | ORAL | @ 07:00:00 | Stop: 2020-01-22

## 2020-01-20 MED ADMIN — MULTIVITAMIN WITH FOLIC ACID 400 MCG TABLET: 400 mcg | ORAL | @ 14:00:00 | Stop: 2020-01-22

## 2020-01-20 MED ADMIN — ACETAMINOPHEN 325 MG TABLET: 325 mg | ORAL | @ 20:00:00 | Stop: 2020-01-22

## 2020-01-20 MED ADMIN — ACETAMINOPHEN 325 MG TABLET: 325 mg | ORAL | @ 07:00:00 | Stop: 2020-01-22

## 2020-01-20 MED ADMIN — HEPARIN (PORCINE) 5,000 UNIT/ML INJECTION SOLUTION: SUBCUTANEOUS | @ 20:00:00 | Stop: 2020-01-22

## 2020-01-20 MED ADMIN — SENNOSIDES 8.6 MG-DOCUSATE SODIUM 50 MG TABLET: ORAL | @ 14:00:00 | Stop: 2020-01-22

## 2020-01-20 MED ADMIN — ACETAMINOPHEN 325 MG TABLET: 325 mg | ORAL | @ 14:00:00 | Stop: 2020-01-22

## 2020-01-20 MED ADMIN — HEPARIN (PORCINE) 5,000 UNIT/ML INJECTION SOLUTION: SUBCUTANEOUS | @ 11:00:00 | Stop: 2020-01-22

## 2020-01-20 MED ADMIN — ROSUVASTATIN 40 MG TABLET: 40 mg | ORAL | @ 14:00:00 | Stop: 2020-01-22

## 2020-01-20 MED ADMIN — POLYETHYLENE GLYCOL 3350 17 GRAM ORAL POWDER PACKET: 17 gram | ORAL | @ 14:00:00 | Stop: 2020-01-22

## 2020-01-20 MED ADMIN — ASPIRIN 81 MG CHEWABLE TABLET: 81 mg | ORAL | @ 14:00:00 | Stop: 2020-01-22

## 2020-01-20 MED ADMIN — CAMPHOR-MENTHOL 0.5 %-0.5 % LOTION: TOPICAL | @ 07:00:00 | Stop: 2020-01-22

## 2020-01-20 NOTE — Progress Notes
[facility] Stroke Inpatient Progress NoteAdmit Date: 1/21/2021Hospital Day:  Hospital Day: 9HPI:  64 yo M w/ PMH s/o MI, CAD, HTN and active smoker on daily ASA, s/p L3-5 decompressive lumbar laminectomy (01/09/20) and hematoma evacuation/washout (01/10/20) that presented as a stroke code from [facility] on POD#1 for confusion, and significant R leg weakness, found to have multiple strokes on MRI and a L>R ICA stenosis. Patient decline MR perfusion given his claustrophobia despite offer of Ativan.  Pt was transferred to [facility] Stroke SDU 1/24 for further evaluation and management with NSG and Ortho closely following.Perioperatively home meds: ACEI and ASA were held.  ASA resumed on 1/23.Hospital Course:- S/p right CEA- pain management consult  placed24 Hour Events:  - NAEO- EEG negative Today:Pain well controlled. No neurologic symptoms today. Feels well and ready to go to rehab; motivated to get stronger.Physical Exam: Vitals:Temp:  [97.3 ?F (36.3 ?C)-98.8 ?F (37.1 ?C)] 97.6 ?F (36.4 ?C)Pulse:  [79-114] 100Resp:  [10-19] 19BP: (142-180)/(68-102) 161/102NIBP MAP (mmHg):  [94-119] 119Physical Exam:General: Well-appearing, sitting comfortably in bed, NADHEENT: Sclera anicteric, mmm, o/p clearNeck: Supple, full ROM, no LAD, non-tenderCardiac: RRR, no m/r/gPulm: CTAB, no w/r/rAbdomen: Soft, NTND, NABSExtremities: 2+ radial and DP pulses, no c/c/eNeuro:MS- AO to self, date/year, YNHH, situation. Naming, repetition, comprehension intact. Speech clear and fluent.CN- PERRL 4mm to 2mm, VFF, EOMI no nystagmus, V1-V3 intact to LT, facial movements symmetric, TUP midline, SCM/traps 5/5Motor- Normal bulk and tone, no abnormal movements, no PND, mild weakness with orbiting right arm--RUE: 5/5 throughout--RLE: 0/5 throughout, no spasticity--LUE: 3/5 at hip, 3/5 at knee, 0/5 at ankle/toes--LLE: 5/5 throughoutCoordination-  FNF intact without dysmetria bilaterallyReflexes- Not tested today.Sensory- Intact to LT UE and LE b/l throughout.Gait- deferredLabs: CBC w/ diffRecent Labs Lab 01/29/210052 01/30/210503 01/31/210223 WBC 5.9 4.6 5.1 HGB 8.6* 8.5* 8.1* HCT 26.7* 26.6* 25.2* PLT 169 180 194 MCV 97.1* 100.0* 97.3* NEUTROPHILS 67.6 60.8 61.8  BMPRecent Labs Lab 01/29/210052 01/30/210503 01/31/210223 NA 139 138 143 K 4.2 4.1 4.3 CL 105 104 106 CO2 25 28 27  BUN 20 18 23  CREATININE 0.87 0.79 0.80 ANIONGAP 9 6* 10  DivalentsRecent Labs Lab 01/29/210052 01/30/210503 01/31/210223 CALCIUM 8.2* 8.4* - 8.4* 8.7* - 8.7* MG 2.1 2.3 2.1 PHOS 4.0 3.2 3.5  LFTsRecent Labs Lab 01/29/210052 02/01/210508 ALT 28 38 AST 37* 38* ALKPHOS 74 92 BILITOT  --  <0.2 BILIDIR  --  <0.2  CoagsRecent Labs Lab 01/27/212001 PTT 22.6* INR 0.99  Stroke Labs:Lab Results Component Value Date  TSH 0.136 (L) 01/12/2020  HGBA1C 6.2 (H) 12/26/2019  LDL 72 01/12/2020  LDL 80 16/09/9603  HDL 71 54/08/8118  CHOL 165 01/12/2020  TRIG 112 01/12/2020  Imaging: Head ImagingCarotid Duplex US Bilateral 1/25/21IMPRESSION: - Findings are 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 The plaque appears extremely hypoechoic and thrombus is not excluded. Similar findings were noted on prior CTA. - Doppler criteria have been correlated with the NASCET method of measuring an internal carotid artery stenosis. MRI Brain W Wo Iv Contrast 1/23/21IMPRESSION: Multifocal small patchy areas of acute ischemia/infarct within bilateral ACA and MCA territories, likely embolic in etiology.  No significant mass effect.?CTA Head/Neck 1/23/21IMPRESSION:1.  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 Thromboembolism versus dissection considered. Near-complete occlusion of the right proximal internal carotid artery at the origin. More superior contrast filling may be retrograde flow through the circle of Willis.2.  No proximal vessel cut off, significant stenosis, or aneurysm  on CTA head. Specifically, no discrete vessel cut off associated with areas of probable infarct seen on concurrent noncontrast [place] head. Moderate intracranial atherosclerotic disease.3.  Consider MRI of the brain for characterization of any infarct. Consider MRA of the neck with and without contrast for delineation of any dissection, if warranted. ?CTH  1/23/21IMPRESSION:1.  Small scattered infarcts (transcortical hypodensity) at the left greater than right posterior superior frontal lobes. These may relate to watershed and/or embolic etiologies. MRI is recommended to help further determine the chronicity of these findings.2.  No acute intracranial hemorrhage.3.  See report of concurrent CTA head and neck for further findings.Cardiac ImagingTTE 01/01/20 * Sinus tachycardia noted at rest.* Normal left ventricular size, thickness, systolic function and wall motion. LVEF calculated by biplane Simpson's was 57%.* Normal right ventricular cavity size and systolic function.  Estimated right ventricular systolic pressure is 20 mmHg.  Right ventricular systolic pressure is normal.* Thickened mitral valve.  Trace mitral regurgitation.* Trace tricuspid regurgitation.* Compared to prior echocardiogram dated, 02/06/2019, there is no significant change.Assessment: 57M with h/o HTN, HLD, CHF and chronic LLE weakness who is POD3 after lumbar laminectomy and s/p surgical site hematoma evacuation. On 1/23 he was noted to be altered and with right-sided weakness after an unclear last known well. 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 MRI shows bilateral watershed vs embolic strokes. His L ICA occlusion is thought most likely to be chronic with good collateralization given the absence of a total L ICA syndrome. Possibly his presentation can be explained by transient hypoperfusion distal to his R ICA disease versus atheroembolism.Plan: NEURO#Mulifocal, watershed distribution strokes iso hypoperfusion event periop given bilateral carotid occlusions (L total occlusion, with collateralization supplying L side)#R carotid subtotal stenosis- Stroke labs: Hb A1C 6.7, lipid panel/LDL wnl, TSH 0.136- TTE with bubble-- last TTE done 1/13 had EF 57%- Carotid Duplex US bilateral completed for surgical planning 1/25- S/P RCEA  1/28 -?ASA 325, defer heparin gtt (risk iso recent surgery greater than benefit given recent surgery)- Rosuvastatin 40 qd- BP goal 120-180- Cont tele- Q4H neurochecks, Q4h vitals- Maintain euglycemia, eunatremia, euthermia- PT/OT- recommending acute rehab#S/p lumbar laminectomy (POD#5)- Ortho spine consulted, appreciate recs for surgical site care- Hemovac drain out 1/25- Cleared for working with PT - ambulate as tolerate, sit up in chair- Maintain lumbar corset- Pain management while avoiding sedation, well controlled on current regimen:- Tylenol 975 q6h- PRN Oxy 5/10- PRN Flexeril and valium- PRN Zofran & Reglan- pain management consult- monitor H&H  8.1/25.2  On 1/31, Down from 8.5/26.6 on 1/30#Chronic problems- nortriptyline 75 QHS- nicotine patch 21 mg/24hCARDIO- SBP goal 140-180- TTE with bubble- Telemetry- ROMI with serial troponin neg- EKG sinus tachy#Sinus tachycardia- Increased Metop 50mg  bid in lieu of home Troprol XL 100mg  --> better HR to 90s-low 100sENDOCRINE#DM- A1C 6.2- Diet: consistent carb- SSI medium dose TID AC + QHSPULM- CXR wnl- NC prn- Apneic event overnight 1/24, likely OSA, will need OP sleep studyMISC- Diet: Cardiac- Bowel regimen: senna+ BID, prn dulcolax 10mg  suppository and milk of mag- DVT ppx: SQH 5000U q8, SCD's- Code status: FULL- Family Updated: (Wife) Thomas Hoff- Dispo: Acute rehab at [place], accepted for tele bed.  Medically ready for discharge.Lupita Shutter, MDInternal Medicine, PGY-1Available on MHBAttending AddendumI saw the patient with the neurology team this morning. I performed the critical elements of neurological evaluation including a history, neurological examination, and the assessment. I agree with the findings and recommendations with the following additions and modifications: 57M with HTN, HLD, CAD,  HFrEF, tobacco use, recent lumbar spine laminectomy (1/21 c/b hematoma s/p evacuation) who developed R sided weakness on 1/23. ?Vessel imaging showed bilateral ICA disease (L ICA fully occluded and R ICA with severe stenosis + luminal thrombus). ?Anticoagulation deferred given recent surgery, hematoma. ?MRI shows bilateral infarcts in watershed territories. ?Carotid ultrasound consistent with known L ICA occlusion and near-occlusion of the R ICA with possible thrombus. ?He is s/p R CEA on 1/28, tolerated procedure well. ?Exam notable for R sided weakness: leg is plegic, mild weakness/orbiting with the arm. ?Now awaiting placement to acute rehab. ?Working on pain control, continue aspirin and high intensity statin. ?Per neurosurgery: follow up in 1 month with Dr. Sarita Haver with repeat carotid ultrasound at that time. Awaiting rehab.?Remainder of plan as outlined above.Jayra Choyce P. Nelson Chimes, MDStroke Service Attending

## 2020-01-20 NOTE — Plan of Care
Inpatient Occupational Therapy Progress Note Default Flowsheet Data (most recent)  IP Adult OT Eval/Treat - 01/20/20 1510    Date of Visit / Treatment  Date of Visit / Treatment  01/20/20   End Time  1510    General Information  Subjective  I am going to be walking in one month   General Observations  Pt received in bs chair w/ LSO donned, +inactive PIV in R hand; cleared for therapy. RN present   Precautions/Limitations  fall precautions   Precautions/Limitations Comment  LSO for OOB   Hand Dominance  right    Weight Bearing Status  Weight Bearing Status  WNL - Within normal limits    Vital Signs and Orthostatic Vital Signs  Vital Signs  Vital Signs Stable    Pain/Comfort  Pain Comment (Pre/Post Treatment Pain)  7/10 pain in back, RN aware and in to medicate at end of session    Patient Coping  Observed Emotional State  accepting    Cognition  Level of Consciousness  alert   Following Commands  Follows one step commands without difficulty   Personal Safety / Judgment  Decreased recognition / insight of own deficits    Musculoskeletal  LUE Muscle Strength Grading  4-->active movement against gravity and resistance   RUE Muscle Strength Grading  4-->active movement against gravity and resistance    Skin Assessment  Skin Assessment  See Nursing Documentation    Balance  Sitting Balance: Static   FAIR+     Maintains static position without assist or device, may require Supervision or Verbal Cues (>2 minutes)   Sitting Balance: Dynamic   FAIR      Performs dynamic activities through 75% range Contact Guard or partial range (50-75%) with Supervision   Standing Balance: Static  POOR+    Minimal assist to maintain static position with no Assistive Device   Standing Balance: Dynamic   POOR+   Moves through 1/2 range with minimal assist to right self   Balance Assist Device  Rolling walker   Balance Skills Training Comment Ax1     AM-PAC - Daily Activity IP Short Form  Help needed from another person putting on/taking off regular lower body clothing  2 - A Lot   Help needed from another person for bathing (incl. washing, rinsing, drying)  2 - A Lot   Help needed from another person for toileting (incl. using toilet, bedpan, urinal)  2 - A Lot   Help needed from another person putting on/taking off regular upper body clothing  2 - A Lot   Help needed from another person taking care of personal grooming such as brushing teeth  2 - A Lot   Help needed from another person eating meals  4 - None   AM-PAC Daily Activity Raw Score (Total of rows above)  14   CMS Score (based on Raw Score - with G Code)  14 - 59.67% impaired      (G Code - CK)   Comments  pt refused ADL participation, attempted to edu on safe LB technique however pt w/ preferring self directed techniques and reluctant to edu; overall impaired insight    Training and development officer  Sit-to-Stand Transfer Independence/Assistance Level  Minimum assist;Assist of 1   Sit-to-Stand Transfer Assist Device  Rolling walker   Sit-Stand Transfer Comments  cues for technique and placement of RLE, RLE also blocked    Gait Training  Independence/Assistance Level   Moderate assist;Assist of 1  Assistive Device   Rolling walker   Gait Distance  25 feet;x2   Gait Training Comments  assist to advance RLE    Handoff Documentation  Handoff  Patient in chair;Pressure relief cushion;Patient instructed to call nursing for mobility;Discussed with nursing   Handoff Comments  Call bell in reach, RN present    Endurance  Endurance Comments  Fair    Orthotic Fabrication/Training  Orthotics Fabrication/Training Comments  LSO donned    Clinical Impression  Follow up Assessment  Pt tolerated session well. A x1 w/ RW. Pt limited by RLE weakness, impaired balance, impaired activity tolerance, and decrease insight. Pt will benefit from OT while in house to address deficits and optimize level of functioning.   Criteria for Skilled Therapeutic Interventions Met  yes;treatment indicated    Frequency/Equipment Recommendations  OT Frequency  3x per week  Wed 2/3   OT Recommendations for Inpatient Admission  ADL Recommendations  assist of 1;with rolling walker;bedside commode    Planned Treatment / Interventions  Education Treatment / Interventions  Patient Education / Training    OT Discharge Summary  OT Disposition Recommendation(s)  Acute Rehab    Lenzy Kerschner MS OTR/L [employer] [phone]
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
## 2020-01-20 NOTE — Plan of Care
Plan of Care Overview/ Patient Status    Pta/o x4. VSS on room air. Neck incision OTA and intact, neck circ. 52.5cm. Back incision covered w/ island dressing, CDI. NIHSS 5. PERRLA, speech clear, follows commands, +CMS. Strength 5/5 BUE, 1/5 RLE and 4/5 LLE. Pain controlled w/ tylenol and oxy. Voiding in urinal. BM 1/31. Plan to DC to acute rehab. See flowsheets for details. Problem: Adult Inpatient Plan of CareGoal: Plan of Care ReviewOutcome: Interventions implemented as appropriateGoal: Patient-Specific Goal (Individualized)Outcome: Interventions implemented as appropriateGoal: Absence of Hospital-Acquired Illness or InjuryOutcome: Interventions implemented as appropriateGoal: Optimal Comfort and WellbeingOutcome: Interventions implemented as appropriateGoal: Readiness for Transition of CareOutcome: Interventions implemented as appropriate Problem: Skin Injury Risk IncreasedGoal: Skin Health and IntegrityOutcome: Interventions implemented as appropriate Problem: Fall Injury RiskGoal: Absence of Fall and Fall-Related InjuryOutcome: Interventions implemented as appropriate Problem: WoundGoal: Optimal Wound HealingOutcome: Interventions implemented as appropriate Problem: Bleeding (Spinal Surgery)Goal: Absence of BleedingOutcome: Interventions implemented as appropriate Problem: Bowel Elimination Impaired (Spinal Surgery)Goal: Effective Bowel EliminationOutcome: Interventions implemented as appropriate Problem: Functional Ability Impaired (Spinal Surgery)Goal: Optimal Functional AbilityOutcome: Interventions implemented as appropriate Problem: Infection (Spinal Surgery)Goal: Absence of Infection Signs and SymptomsOutcome: Interventions implemented as appropriate Problem: Neurologic Impairment (Spinal Surgery)Goal: Optimal Neurologic FunctionOutcome: Interventions implemented as appropriate Problem: Ongoing Anesthesia Effects (Spinal Surgery)Goal: Anesthesia/Sedation RecoveryOutcome: Interventions implemented as appropriate Problem: Pain (Spinal Surgery)Goal: Acceptable Pain ControlOutcome: Interventions implemented as appropriate Problem: Postoperative Nausea and Vomiting (Spinal Surgery)Goal: Nausea and Vomiting ReliefOutcome: Interventions implemented as appropriate Problem: Postoperative Urinary Retention (Spinal Surgery)Goal: Effective Urinary EliminationOutcome: Interventions implemented as appropriate Problem: InfectionGoal: Infection Symptom ResolutionOutcome: Interventions implemented as appropriate Problem: Physical Therapy GoalsGoal: Rehab Plan of Care Review, PTOutcome: Interventions implemented as appropriateGoal: Physical Therapy GoalsDescription: PT GOALS1. Patient will perform bed mobility independently. 2. Patient will perform sit to stand transfers with minAx2 and least assistive device.3. Patient will perform bed to chair transfer with mindAx2 and least assistive device. Outcome: Interventions implemented as appropriate Problem: Occupational Therapy GoalsGoal: Rehab Plan of Care Review, OTOutcome: Interventions implemented as appropriateGoal: Occupational Therapy GoalsDescription: OT EVAL 01/15/20: RE EVAL 1/29/21CARRY FORWARD ALL GOALS BELOW1. Pt. Will complete bed mobility with CGA 2. Pt. Will demonstrate GOOD balance when seated at EOB3. Pt. Will complete toileting with MOD A using AE as needed4. Pt. Will demonstrate improved activity tolerance to stand x 8 mins when completing functional tasks.5. Pt. Will demonstrate improved BUE strength to 5/5 during UE dressing.6. Pt. Will be able to complete BLE bathing with MOD A Outcome: Interventions implemented as appropriate

## 2020-01-21 LAB — CBC WITH AUTO DIFFERENTIAL
BKR WAM ABSOLUTE IMMATURE GRANULOCYTES: 0 x 1000/ÂµL (ref 0.0–0.3)
BKR WAM ABSOLUTE LYMPHOCYTE COUNT: 1.5 x 1000/ÂµL (ref 1.0–4.0)
BKR WAM ABSOLUTE NRBC: 0 x 1000/ÂµL (ref 0.0–0.0)
BKR WAM ANALYZER ANC: 2.7 x 1000/ÂµL (ref 1.0–11.0)
BKR WAM BASOPHIL ABSOLUTE COUNT: 0 x 1000/ÂµL (ref 0.0–0.0)
BKR WAM BASOPHILS: 0.8 % (ref 0.0–4.0)
BKR WAM EOSINOPHIL ABSOLUTE COUNT: 0.1 x 1000/ÂµL (ref 0.0–1.0)
BKR WAM EOSINOPHILS: 2.7 % (ref 0.0–7.0)
BKR WAM HEMATOCRIT: 28.9 % — ABNORMAL LOW (ref 37.0–52.0)
BKR WAM HEMOGLOBIN: 9.3 g/dL — ABNORMAL LOW (ref 12.0–18.0)
BKR WAM IMMATURE GRANULOCYTES: 0.2 % (ref 0.0–3.0)
BKR WAM LYMPHOCYTES: 30.4 % (ref 8.0–49.0)
BKR WAM MCH (PG): 30.5 pg (ref 27.0–31.0)
BKR WAM MCHC: 32.2 g/dL (ref 31.0–36.0)
BKR WAM MCV: 94.8 fL — ABNORMAL HIGH (ref 78.0–94.0)
BKR WAM MONOCYTE ABSOLUTE COUNT: 0.4 x 1000/ÂµL (ref 0.0–2.0)
BKR WAM MONOCYTES: 9.1 % (ref 4.0–15.0)
BKR WAM MPV: 9.6 fL (ref 6.0–11.0)
BKR WAM NEUTROPHILS: 56.8 % (ref 37.0–84.0)
BKR WAM NUCLEATED RED BLOOD CELLS: 0 % (ref 0.0–1.0)
BKR WAM PLATELETS: 259 x1000/ÂµL (ref 140–440)
BKR WAM RDW-CV: 13.1 % (ref 11.5–14.5)
BKR WAM RED BLOOD CELL COUNT: 3.1 M/ÂµL — ABNORMAL LOW (ref 3.8–5.9)
BKR WAM WHITE BLOOD CELL COUNT: 4.8 x1000/ÂµL (ref 4.0–10.0)

## 2020-01-21 LAB — MAGNESIUM: BKR MAGNESIUM: 2.2 mg/dL (ref 1.7–2.4)

## 2020-01-21 LAB — BASIC METABOLIC PANEL
BKR ANION GAP: 10 (ref 7–17)
BKR BLOOD UREA NITROGEN: 23 mg/dL (ref 8–23)
BKR BUN / CREAT RATIO: 25.6 — ABNORMAL HIGH (ref 8.0–23.0)
BKR CALCIUM: 9.2 mg/dL (ref 8.8–10.2)
BKR CHLORIDE: 104 mmol/L (ref 98–107)
BKR CO2: 25 mmol/L (ref 20–30)
BKR CREATININE: 0.9 mg/dL (ref 0.40–1.30)
BKR EGFR (AFR AMER): >60 mL/min/{1.73_m2} (ref >60)
BKR EGFR (NON AFRICAN AMERICAN): >60 mL/min/{1.73_m2} (ref >60)
BKR GLUCOSE: 107 mg/dL — ABNORMAL HIGH (ref 70–100)
BKR POTASSIUM: 4.2 mmol/L (ref 3.3–5.1)
BKR SODIUM: 139 mmol/L (ref 136–144)

## 2020-01-21 LAB — COVID-19 CLEARANCE OR FOR PLACEMENT ONLY: BKR SARS-COV-2 RNA (COVID-19) (YH): NEGATIVE

## 2020-01-21 MED ORDER — LACTATED RINGERS INTRAVENOUS SOLUTION
INTRAVENOUS | Status: DC
Start: 2020-01-21 — End: 2020-01-22

## 2020-01-21 MED ORDER — LACTATED RINGERS IV BOLUS FROM BAG
INTRAVENOUS | Status: DC
Start: 2020-01-21 — End: 2020-01-22

## 2020-01-21 MED ADMIN — MULTIVITAMIN WITH FOLIC ACID 400 MCG TABLET: 400 mcg | ORAL | @ 14:00:00 | Stop: 2020-01-22

## 2020-01-21 MED ADMIN — HEPARIN (PORCINE) 5,000 UNIT/ML INJECTION SOLUTION: SUBCUTANEOUS | @ 05:00:00 | Stop: 2020-01-22

## 2020-01-21 MED ADMIN — ACETAMINOPHEN 325 MG TABLET: 325 mg | ORAL | @ 21:00:00 | Stop: 2020-01-22

## 2020-01-21 MED ADMIN — CAMPHOR-MENTHOL 0.5 %-0.5 % LOTION: TOPICAL | @ 01:00:00 | Stop: 2020-01-22

## 2020-01-21 MED ADMIN — NORTRIPTYLINE 25 MG CAPSULE: 25 mg | ORAL | @ 01:00:00 | Stop: 2020-01-22

## 2020-01-21 MED ADMIN — METOPROLOL TARTRATE IMMEDIATE RELEASE 50 MG TABLET: 50 mg | ORAL | @ 14:00:00 | Stop: 2020-01-22

## 2020-01-21 MED ADMIN — OXYCODONE IMMEDIATE RELEASE 5 MG TABLET: 5 mg | ORAL | @ 14:00:00 | Stop: 2020-01-22

## 2020-01-21 MED ADMIN — SENNOSIDES 8.6 MG-DOCUSATE SODIUM 50 MG TABLET: ORAL | @ 14:00:00 | Stop: 2020-01-22

## 2020-01-21 MED ADMIN — METOPROLOL TARTRATE IMMEDIATE RELEASE 50 MG TABLET: 50 mg | ORAL | @ 01:00:00 | Stop: 2020-01-22

## 2020-01-21 MED ADMIN — ASPIRIN 81 MG CHEWABLE TABLET: 81 mg | ORAL | @ 14:00:00 | Stop: 2020-01-22

## 2020-01-21 MED ADMIN — ROSUVASTATIN 40 MG TABLET: 40 mg | ORAL | @ 14:00:00 | Stop: 2020-01-22

## 2020-01-21 MED ADMIN — OXYCODONE IMMEDIATE RELEASE 5 MG TABLET: 5 mg | ORAL | @ 05:00:00 | Stop: 2020-01-22

## 2020-01-21 MED ADMIN — MELATONIN 3 MG TABLET: 3 mg | ORAL | @ 01:00:00 | Stop: 2020-01-22

## 2020-01-21 MED ADMIN — ACETAMINOPHEN 325 MG TABLET: 325 mg | ORAL | @ 01:00:00 | Stop: 2020-01-22

## 2020-01-21 MED ADMIN — HEPARIN (PORCINE) 5,000 UNIT/ML INJECTION SOLUTION: SUBCUTANEOUS | @ 21:00:00 | Stop: 2020-01-22

## 2020-01-21 MED ADMIN — HEPARIN (PORCINE) 5,000 UNIT/ML INJECTION SOLUTION: SUBCUTANEOUS | @ 10:00:00 | Stop: 2020-01-22

## 2020-01-21 MED ADMIN — POLYETHYLENE GLYCOL 3350 17 GRAM ORAL POWDER PACKET: 17 gram | ORAL | @ 14:00:00 | Stop: 2020-01-22

## 2020-01-21 MED ADMIN — SENNOSIDES 8.6 MG-DOCUSATE SODIUM 50 MG TABLET: ORAL | @ 01:00:00 | Stop: 2020-01-22

## 2020-01-21 MED ADMIN — OXYCODONE IMMEDIATE RELEASE 5 MG TABLET: 5 mg | ORAL | @ 21:00:00 | Stop: 2020-01-22

## 2020-01-21 MED ADMIN — ACETAMINOPHEN 325 MG TABLET: 325 mg | ORAL | @ 14:00:00 | Stop: 2020-01-22

## 2020-01-21 NOTE — Progress Notes
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 Denies new headache, numbness, weakness. Physical Exam: Vitals:Temp:  [97.3 ?F (36.3 ?C)-98.8 ?F (37.1 ?C)] 97.6 ?F (36.4 ?C)Pulse:  [79-114] 100Resp:  [10-19] 19BP: (142-180)/(68-102) 161/102NIBP MAP (mmHg):  [94-119] 119Physical Exam:General: Well-appearing, sitting comfortably in bed, NADHEENT: Sclera anicteric, mmm, o/p clearNeck: Supple, full ROM, no LAD, non-tenderCardiac: RRR, no m/r/gPulm: CTAB, no w/r/rAbdomen: Soft, NTND, NABSExtremities: 2+ radial and DP pulses, no c/c/eNeuro:MS- AO to self, date/year, YN[facility], situation. Naming, repetition, comprehension intact. Speech clear and fluent. CN- PERRL 4mm to 2mm, VFF, EOMI no nystagmus, V1-V3 intact to LT, facial movements symmetric, TUP midline, SCM/traps 5/5Motor- Normal bulk and tone, no abnormal movements, no PND, mild weakness with orbiting right arm--RUE: 5/5 throughout--RLE: 3/5 hip flexion, 0/5 knee flexion, dorsiflexion and plantar flexion, no spasticity--LUE: 5/5 throughout--LLE:  3/5 at hip, 3/5 at knee, 0/5 at ankle/toesCoordination-  FNF intact without dysmetria bilaterallyReflexes- 3+ in RLE, 2+ otherwiseSensory- Intact to LT UE and LE b/l throughout.Gait- deferredLabs: CBC w/ diffRecent Labs Lab 01/30/210503 01/31/210223 02/02/210458 WBC 4.6 5.1 4.8 HGB 8.5* 8.1* 9.3* HCT 26.6* 25.2* 28.9* PLT 180 194 259 MCV 100.0* 97.3* 94.8* NEUTROPHILS 60.8 61.8 56.8  BMPRecent Labs Lab 01/30/210503 01/31/210223 02/02/210458 NA 138 143 139 K 4.1 4.3 4.2 CL 104 106 104 CO2 28 27 25  BUN 18 23 23  CREATININE 0.79 0.80 0.90 ANIONGAP 6* 10 10  DivalentsRecent Labs Lab 01/30/210503 01/31/210223 02/02/210458 CALCIUM 8.4* - 8.4* 8.7* - 8.7* 9.2 MG 2.3 2.1 2.2 PHOS 3.2 3.5  --   LFTsRecent Labs Lab 01/29/210052 02/01/210508 ALT 28 38 AST 37* 38* ALKPHOS 74 92 BILITOT  --  <0.2 BILIDIR  --  <0.2  CoagsRecent Labs Lab 01/27/212001 PTT 22.6* INR 0.99  Stroke Labs:Lab Results Component Value Date  TSH 0.136 (L) 01/12/2020  HGBA1C 6.2 (H) 12/26/2019  LDL 72 01/12/2020  LDL 80 40/34/7425  HDL 71 95/63/8756  CHOL 165 01/12/2020  TRIG 112 01/12/2020  Imaging: Head ImagingCarotid Duplex US Bilateral 1/25/21IMPRESSION: - Findings are 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 The plaque appears extremely hypoechoic and thrombus is not excluded. Similar findings were noted on prior CTA. - Doppler criteria have been correlated with the NASCET method of measuring an internal carotid artery stenosis. MRI Brain W Wo Iv Contrast 1/23/21IMPRESSION: Multifocal small patchy areas of acute ischemia/infarct within bilateral ACA and MCA territories, likely embolic in etiology.  No significant mass effect.?CTA Head/Neck 1/23/21IMPRESSION:1.  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 Thromboembolism versus dissection considered. Near-complete occlusion of the right proximal internal carotid artery at the origin. More superior contrast filling may be retrograde flow through the circle of Willis.2.  No proximal vessel cut off, significant stenosis, or aneurysm on CTA head. Specifically, no discrete vessel cut off associated with areas of probable infarct seen on concurrent noncontrast [place] head. Moderate  intracranial atherosclerotic disease.3.  Consider MRI of the brain for characterization of any infarct. Consider MRA of the neck with and without contrast for delineation of any dissection, if warranted. ?CTH  1/23/21IMPRESSION:1.  Small scattered infarcts (transcortical hypodensity) at the left greater than right posterior superior frontal lobes. These may relate to watershed and/or embolic etiologies. MRI is recommended to help further determine the chronicity of these findings.2.  No acute intracranial hemorrhage.3.  See report of concurrent CTA head and neck for further findings.Cardiac ImagingTTE 01/01/20 * Sinus tachycardia noted at rest.* Normal left ventricular size, thickness, systolic function and wall motion. LVEF calculated by biplane Simpson's was 57%.* Normal right ventricular cavity size and systolic function.  Estimated right ventricular systolic pressure is 20 mmHg.  Right ventricular systolic pressure is normal.* Thickened mitral valve.  Trace mitral regurgitation.* Trace tricuspid regurgitation.* Compared to prior echocardiogram dated, 02/06/2019, there is no significant change.Assessment: 81M with h/o HTN, HLD, CHF and chronic LLE weakness who is POD3 after lumbar laminectomy and s/p surgical site hematoma evacuation. On 1/23 he was noted to be altered and with right-sided weakness after an unclear last known well. 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 MRI shows bilateral watershed vs embolic strokes. His L ICA occlusion is thought most likely to be chronic with good collateralization given the absence of a total L ICA syndrome. Possibly his presentation can be explained by transient hypoperfusion distal to his R ICA disease versus atheroembolism.Plan: NEURO#Mulifocal, watershed distribution strokes iso hypoperfusion event periop given bilateral carotid occlusions (L total occlusion, with collateralization supplying L side)#R carotid subtotal stenosis- Stroke labs: Hb A1C 6.7, lipid panel/LDL wnl, TSH 0.136- TTE with bubble-- last TTE done 1/13 had EF 57%- Carotid Duplex US bilateral completed for surgical planning 1/25- S/P RCEA  1/28 -?ASA 325, defer heparin gtt (risk iso recent surgery greater than benefit given recent surgery)- Rosuvastatin 40 qd- BP goal 120-180- Cont tele- Q4H neurochecks, Q4h vitals- Maintain euglycemia, eunatremia, euthermia- PT/OT- recommending acute rehab#S/p lumbar laminectomy- Ortho spine consulted, appreciate recs for surgical site care- Cleared for working with PT - ambulate as tolerate, sit up in chair- Maintain lumbar corset- Pain management while avoiding sedation, well controlled on current regimen:	- Tylenol 975 q6h	- PRN Oxy 5/10	- PRN Flexeril and valium	- PRN Zofran & Reglan- pain management consult- monitor H&H  8.1/25.2  On 1/31, Down from 8.5/26.6 on 1/30#Chronic problems- nortriptyline 75 QHS- nicotine patch 21 mg/24hCARDIO- SBP goal 140-180- TTE with bubble- Telemetry- ROMI with serial troponin neg- EKG sinus tachy#Sinus tachycardia- ContinueMetop 50mg  bid in lieu of home Troprol XL 100mg  --> better HR to 90s-low 100sENDOCRINE#DM- A1C 6.2- Diet: cardiac consistent carb- SSI medium dose TID AC + QHSPULM- CXR wnl- NC prn- Apneic event overnight 1/24, likely OSA, will need OP sleep studyMISC- Diet: Cardiac consistent carb- Bowel regimen: senna+ BID, prn dulcolax 10mg  suppository and milk of mag- DVT ppx: SQH 5000U q8, SCD's- Code status: FULL- Family Updated: (Wife) Derek Hudson- Dispo: Acute rehab at [facility], accepted for tele bed.  Medically ready for discharge awaiting bed availability.Derek Hudson, MDYale Internal Medicine, PGY-1MHB: [phone]?Attending Addendum?I saw the patient with the neurology team this morning. I performed the critical elements of neurological evaluation including a history, neurological examination, and the assessment. I agree with the findings and recommendations with the following additions and modifications: ?81M with HTN, HLD, CAD, HFrEF, tobacco use, recent lumbar spine laminectomy (1/21 c/b hematoma s/p evacuation) who developed R sided weakness on 1/23. 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 ?Anticoagulation deferred given recent surgery, hematoma. ?MRI shows bilateral infarcts in watershed territories. ?Carotid ultrasound consistent with known L ICA occlusion and near-occlusion of the R ICA with possible thrombus. ?He is s/p R CEA on 1/28, tolerated procedure well. ?Exam notable for R sided weakness: leg is plegic, mild weakness/orbiting with the arm. ?Now awaiting placement to acute rehab. ?Working on pain control, continue aspirin and high intensity statin. ?Per neurosurgery: follow up in 1 month with Dr. Sarita Haver with repeat carotid ultrasound at that time. Awaiting rehab.?Remainder of plan as outlined above.?Mattew Chriswell P. Nelson Hudson, MDStroke Service Attending

## 2020-01-21 NOTE — Plan of Care
Inpatient Physical Therapy Progress NoteIP Adult PT Eval/Treat - 01/21/20 1119    Date of Visit / Treatment  Date of Visit / Treatment  01/21/20   End Time  1119    General Information  Subjective  I want to walk by the end of the month   General Observations  Pt received in supine, motivated for mobility.    Precautions/Limitations  fall precautions   Precautions/Limitations Comment  LSO OOB    Vital Signs and Orthostatic Vital Signs  Vital Signs  Vital Signs Stable    Pain/Comfort  Location #1 - PreTreatment Rating (Numbers Scale)  0/10 - no pain   Posttreatment Rating (Numbers Scale)  0/10 - no pain    Patient Coping  Observed Emotional State  accepting;calm;cooperative    Cognition  Overall Cognitive Status  WFL    Manual Muscle Testing  Manual Muscle Testing Comments  RLE: 0/5 hip flex, 1/5 knee ext, 0/5 DF    Musculoskeletal  LUE Muscle Strength Grading  4-->active movement against gravity and resistance   RUE Muscle Strength Grading  4-->active movement against gravity and resistance   LLE Muscle Strength Grading  3-->active movement against gravity   RLE Muscle Strength Grading  1-->flicker or trace of contraction    Skin Assessment  Skin Assessment  See Nursing Documentation    Balance  Sitting Balance: Static   GOOD-????Maintains static position against minimal resistance with no Assistive Device   Sitting Balance: Dynamic   FAIR+????Performs dynamic activities through full range with Supervision   Standing Balance: Static  FAIR???????Maintains static position without assist or device, may require Supervision or?Verbal Cues (<2 minutes)   Standing Balance: Dynamic   POOR+???Moves through 1/2 range with minimal assist to right self   Balance Assist Device  Rolling walker   Balance Skills Training Comment  Assist x1 for LLE limb advancement    Bed Mobility  Bed Mobility Comments  Pt in chair    Sit-Stand Transfer Training  Sit-to-Stand Transfer Independence/Assistance Level  Minimum assist;Assist of 1   Sit-to-Stand Transfer Assist Device  Rolling walker   Stand-to-Sit Transfer Independence/Assistance Level  Minimum assist;Assist of 1   Stand-to-Sit Transfer Assist Device  Rolling walker   Sit-Stand Transfer Comments  Cues for use of RW    Gait Training  Independence/Assistance Level   Moderate assist;Assist of 1   Assistive Device   Rolling walker   Gait Distance  50 feet;x2   Gait Training Comments  +chair follow. Inconsistent assist for RLE limb advancement. Fatigues quickly, requiring multiple seated rest breaks. Overall steady, reliant on RW for balance.    Handoff Documentation  Handoff  Patient in chair;Patient instructed to call nursing for mobility;Discussed with nursing    Endurance  Endurance Comments  fair    AM-PAC - Basic Mobility Screen- How much help from another person do you currently need.....  Turning from your back to your side while in a a flat bed without using rails?  2 - A Lot - Requires a lot of help (maximum to moderate assistance). Can use assistive devices.   Moving from lying on your back to sitting on the side of a flat bed without using bed rails?  2 - A Lot - Requires a lot of help (maximum to moderate assistance). Can use assistive devices.   Moving to and from a bed to a chair (including a wheelchair)?  2 - A Lot - Requires a lot of help (maximum to moderate assistance). Can use assistive  devices.   Standing up from a chair using your arms(e.g., wheelchair or bedside chair)?  2 - A Lot - Requires a lot of help (maximum to moderate assistance). Can use assistive devices.   To walk in a hospital room?  2 - A Lot - Requires a lot of help (maximum to moderate assistance). Can use assistive devices.   Climbing 3-5 steps with a railing?  1 - Total - Requires total assistance or cannot do it at all.   AMPAC Mobility Score  11   Highest Level of Mobility TARGET  Mobility Level 4,Transfer to chair    Therapeutic Exercise  Therapeutic Exercise Comments  Seated therex, pacing, PLB    Neuromuscular Re-education  Neuromuscular Re-education Comments  gait training, assist with sequencing and limb advancement    Therapeutic Functional Activity  Therapeutic Functional Activity Comments  cues for sit to stand and limb advancement with ambulation     Clinical Impression  Follow up Assessment  Pt tolerated session well today. Improved RLE motor return, 1/5 in knee extension otherwise 0/5 throughout. Pt improved with ambualtion trial. Still requires assist with limb advancement and sequencing, Fatigues quickly, requires multiple seated rest breaks. Will continue to benefit from further PT to improve balance, endurance, strength and functional mobility. Recommend d/c to acute rehab to maximize function.    Frequency/Equipment Recommendations  PT Frequency  3x per week  wed   PT Recommendations for Inpatient Admission  Activity/Level of Assist  ambulate;assist of 2;moderate assistance;with rolling walker    PT Discharge Summary  PT Disposition Recommendation(s)  Acute Rehab   Gwenlyn Saran, SPT

## 2020-01-21 NOTE — Plan of Care
Plan of Care Overview/ Patient Status    A+Ox4. VSS on RA. PERRL. NIHSS 5 for BLE weakness. BUE 5/5, BLE 4/5. Marland Kitchen Neck circumference 52cm. Neck incision OTA,CDI and lower back dressing CDI. 10mg  oxycodone for pain. Corset and AFOs at bedside. Mod Ax1 to bathroom. Await[facility].     Problem: Adult Inpatient Plan of CareGoal: Plan of Care ReviewOutcome: Interventions implemented as appropriateGoal: Patient-Specific Goal (Individualized)Outcome: Interventions implemented as appropriateGoal: Absence of Hospital-Acquired Illness or InjuryOutcome: Interventions implemented as appropriateGoal: Optimal Comfort and WellbeingOutcome: Interventions implemented as appropriateGoal: Readiness for Transition of CareOutcome: Interventions implemented as appropriate Problem: Skin Injury Risk IncreasedGoal: Skin Health and IntegrityOutcome: Interventions implemented as appropriate Problem: Fall Injury RiskGoal: Absence of Fall and Fall-Related InjuryOutcome: Interventions implemented as appropriate Problem: WoundGoal: Optimal Wound HealingOutcome: Interventions implemented as appropriate Problem: Bleeding (Spinal Surgery)Goal: Absence of BleedingOutcome: Interventions implemented as appropriate Problem: Bowel Elimination Impaired (Spinal Surgery)Goal: Effective Bowel EliminationOutcome: Interventions implemented as appropriate Problem: Functional Ability Impaired (Spinal Surgery)Goal: Optimal Functional AbilityOutcome: Interventions implemented as appropriate Problem: Infection (Spinal Surgery)Goal: Absence of Infection Signs and SymptomsOutcome: Interventions implemented as appropriate Problem: Neurologic Impairment (Spinal Surgery)Goal: Optimal Neurologic FunctionOutcome: Interventions implemented as appropriate Problem: Ongoing Anesthesia Effects (Spinal Surgery)Goal: Anesthesia/Sedation RecoveryOutcome: Interventions implemented as appropriate Problem: Pain (Spinal Surgery)Goal: Acceptable Pain ControlOutcome: Interventions implemented as appropriate Problem: Postoperative Nausea and Vomiting (Spinal Surgery)Goal: Nausea and Vomiting ReliefOutcome: Interventions implemented as appropriate Problem: Postoperative Urinary Retention (Spinal Surgery)Goal: Effective Urinary EliminationOutcome: Interventions implemented as appropriate Problem: InfectionGoal: Infection Symptom ResolutionOutcome: Interventions implemented as appropriate

## 2020-01-21 NOTE — Care Coordination-Inpatient
I identified my role as TC from Care Management department .  Medicare Important Message explained to patient over the phone.  Patient stated understanding the notice and was in agreement with the discharge plan.  Patient advised of the right to call the [employer] ([employer]) at [phone] if not in agreement with the discharge from the hospital.  Copy mailed to patient after confirming address.Maribel Luis SmithTransition CoordinatorYNHH- Care Management Dept[address] [address] [place], [address]Office [phone] [phone] [phone]

## 2020-01-21 NOTE — Plan of Care
Stroke Nurse Navigator NotePatient: Derek Gongora BrancucioMRN: [id number] DOB: 01-08-1957Presenting symptoms: Stroke Code1:47 PMI stopped back in to see Derek Hudson today to assist with his MyChart activation. I had previously sent activation code via text and email. He opted to not set up at this time, stating it was too complicated and stressed him out. I briefly showed him how to set up for future, which he was appreciative of. I was able to schedule an outpatient stroke center follow-up at the [facility] location and also rescheduled his neurology appointment that he missed while hospitalized. AVS updated. Pending discharge at this time. Offered no further questions or concerns. Cecil Cranker, MPH, RN Stroke Nurse Navigator [facility] MHB: [phone] Signed by Seward Carol, RN, February 2, 2021Available on Mobile Heartbeat

## 2020-01-21 NOTE — Plan of Care
Plan of Care Overview/ Patient Status    Pta/o x4. VSS on room air. Neck incision OTA and intact, neck circ. 51-51.5cm. Back incision covered w/ island dressing, CDI. NIHSS 5. PERRLA, speech clear, follows commands, +CMS. Strength 5/5 BUE, 1/5 RLE and 4/5 LLE. Pain controlled w/ tylenol and oxy. Voiding in urinal. BM 1/31. Plan to DC to acute rehab. See flowsheets for details. Problem: Adult Inpatient Plan of CareGoal: Plan of Care ReviewOutcome: Interventions implemented as appropriateGoal: Patient-Specific Goal (Individualized)Outcome: Interventions implemented as appropriateGoal: Absence of Hospital-Acquired Illness or InjuryOutcome: Interventions implemented as appropriateGoal: Optimal Comfort and WellbeingOutcome: Interventions implemented as appropriateGoal: Readiness for Transition of CareOutcome: Interventions implemented as appropriate Problem: Skin Injury Risk IncreasedGoal: Skin Health and IntegrityOutcome: Interventions implemented as appropriate Problem: Fall Injury RiskGoal: Absence of Fall and Fall-Related InjuryOutcome: Interventions implemented as appropriate Problem: WoundGoal: Optimal Wound HealingOutcome: Interventions implemented as appropriate Problem: Bleeding (Spinal Surgery)Goal: Absence of BleedingOutcome: Interventions implemented as appropriate Problem: Bowel Elimination Impaired (Spinal Surgery)Goal: Effective Bowel EliminationOutcome: Interventions implemented as appropriate Problem: Functional Ability Impaired (Spinal Surgery)Goal: Optimal Functional AbilityOutcome: Interventions implemented as appropriate Problem: Infection (Spinal Surgery)Goal: Absence of Infection Signs and SymptomsOutcome: Interventions implemented as appropriate Problem: Neurologic Impairment (Spinal Surgery)Goal: Optimal Neurologic FunctionOutcome: Interventions implemented as appropriate Problem: Ongoing Anesthesia Effects (Spinal Surgery)Goal: Anesthesia/Sedation RecoveryOutcome: Interventions implemented as appropriate Problem: Pain (Spinal Surgery)Goal: Acceptable Pain ControlOutcome: Interventions implemented as appropriate Problem: Postoperative Nausea and Vomiting (Spinal Surgery)Goal: Nausea and Vomiting ReliefOutcome: Interventions implemented as appropriate Problem: Postoperative Urinary Retention (Spinal Surgery)Goal: Effective Urinary EliminationOutcome: Interventions implemented as appropriate Problem: InfectionGoal: Infection Symptom ResolutionOutcome: Interventions implemented as appropriate Problem: Physical Therapy GoalsGoal: Rehab Plan of Care Review, PTOutcome: Interventions implemented as appropriateGoal: Physical Therapy GoalsDescription: PT GOALS1. Patient will perform bed mobility independently. 2. Patient will perform sit to stand transfers with minAx2 and least assistive device.3. Patient will perform bed to chair transfer with mindAx2 and least assistive device. Outcome: Interventions implemented as appropriate Problem: Occupational Therapy GoalsGoal: Rehab Plan of Care Review, OTOutcome: Interventions implemented as appropriateGoal: Occupational Therapy GoalsDescription: OT EVAL 01/15/20: RE EVAL 1/29/21CARRY FORWARD ALL GOALS BELOW1. Pt. Will complete bed mobility with CGA 2. Pt. Will demonstrate GOOD balance when seated at EOB3. Pt. Will complete toileting with MOD A using AE as needed4. Pt. Will demonstrate improved activity tolerance to stand x 8 mins when completing functional tasks.5. Pt. Will demonstrate improved BUE strength to 5/5 during UE dressing.6. Pt. Will be able to complete BLE bathing with MOD A Outcome: Interventions implemented as appropriate

## 2020-01-21 NOTE — Plan of Care
Problem: Adult Inpatient Plan of CareGoal: Plan of Care ReviewOutcome: Interventions implemented as appropriateGoal: Patient-Specific Goal (Individualized)Outcome: Interventions implemented as appropriateGoal: Absence of Hospital-Acquired Illness or InjuryOutcome: Interventions implemented as appropriateGoal: Optimal Comfort and WellbeingOutcome: Interventions implemented as appropriateGoal: Readiness for Transition of CareOutcome: Interventions implemented as appropriate Problem: Skin Injury Risk IncreasedGoal: Skin Health and IntegrityOutcome: Interventions implemented as appropriate Problem: Fall Injury RiskGoal: Absence of Fall and Fall-Related InjuryOutcome: Interventions implemented as appropriate Problem: WoundGoal: Optimal Wound HealingOutcome: Interventions implemented as appropriate Problem: Bleeding (Spinal Surgery)Goal: Absence of BleedingOutcome: Interventions implemented as appropriate Problem: Bowel Elimination Impaired (Spinal Surgery)Goal: Effective Bowel EliminationOutcome: Interventions implemented as appropriate Problem: Functional Ability Impaired (Spinal Surgery)Goal: Optimal Functional AbilityOutcome: Interventions implemented as appropriate Problem: Infection (Spinal Surgery)Goal: Absence of Infection Signs and SymptomsOutcome: Interventions implemented as appropriate Problem: Neurologic Impairment (Spinal Surgery)Goal: Optimal Neurologic FunctionOutcome: Interventions implemented as appropriate Problem: Ongoing Anesthesia Effects (Spinal Surgery)Goal: Anesthesia/Sedation RecoveryOutcome: Interventions implemented as appropriate Problem: Pain (Spinal Surgery)Goal: Acceptable Pain ControlOutcome: Interventions implemented as appropriate Problem: Postoperative Nausea and Vomiting (Spinal Surgery)Goal: Nausea and Vomiting ReliefOutcome: Interventions implemented as appropriate Problem: Postoperative Urinary Retention (Spinal Surgery)Goal: Effective Urinary EliminationOutcome: Interventions implemented as appropriate Problem: InfectionGoal: Infection Symptom ResolutionOutcome: Interventions implemented as appropriate Problem: Physical Therapy GoalsGoal: Rehab Plan of Care Review, PTOutcome: Interventions implemented as appropriateGoal: Physical Therapy GoalsDescription: PT GOALS1. Patient will perform bed mobility independently. 2. Patient will perform sit to stand transfers with minAx2 and least assistive device.3. Patient will perform bed to chair transfer with mindAx2 and least assistive device. Outcome: Interventions implemented as appropriate Problem: Occupational Therapy GoalsGoal: Rehab Plan of Care Review, OTOutcome: Interventions implemented as appropriateGoal: Occupational Therapy GoalsDescription: OT EVAL 01/15/20: RE EVAL 1/29/21CARRY FORWARD ALL GOALS BELOW1. Pt. Will complete bed mobility with CGA 2. Pt. Will demonstrate GOOD balance when seated at EOB3. Pt. Will complete toileting with MOD A using AE as needed4. Pt. Will demonstrate improved activity tolerance to stand x 8 mins when completing functional tasks.5. Pt. Will demonstrate improved BUE strength to 5/5 during UE dressing.6. Pt. Will be able to complete BLE bathing with MOD A Outcome: Interventions implemented as appropriate Plan of Care Overview/ Patient Status    Patient A&O x4. VSS on RA. Tele. Pain managed effectively per MAR. PERRLA, clear speech. BUE 5/5 strength, RLE 1/5 strength, LLE 4/5. +CMS. Neck circumference 51 1/2cm. Right neck incision OTA, no drainage noted. Back dsg CDI. Corset brace on when OOB.  AFOs at bedside. Voiding spontaneously in urinal. LBM 01/19/2020. COVID swab done. D/C to rehab tomorrow. Safety maintained, bed alarm on, WCTM. See flowsheet for more details.

## 2020-01-21 NOTE — Plan of Care
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   General Observations  Patient sitting up in bedside chair   Precautions/Limitations  fall precautions   Precautions/Limitations Comment  LSO for comfort    Vital Signs and Orthostatic Vital Signs  Vital Signs  Vital Signs Stable    Pain/Comfort  Pain Comment (Pre/Post Treatment Pain)  No pain reported    Cognition  Overall Cognitive Status  WFL   Orientation Level  Oriented X4   Level of Consciousness  alert   Personal Safety / Judgment  --    Range of Motion  Range of Motion Examination  no ROM deficits were identified    Musculoskeletal  LUE Muscle Strength Grading  4-->active movement against gravity and resistance   RUE Muscle Strength Grading  4-->active movement against gravity and resistance   LLE Muscle Strength Grading  3-->active movement against gravity   RLE Muscle Strength Grading  1-->flicker or trace of contraction    Balance  Sitting Balance: Static   FAIR???????Maintains static position without assist or device, may require Supervision or?Verbal Cues (<2 minutes)   Sitting Balance: Dynamic   FAIR-?????Performs dynamic activities through partial range (50-75%) with Contact Guard   Standing Balance: Static  FAIR-??????Contact Guard to maintain static position with no Assistive Device   Standing Balance: Dynamic   FAIR-?????Performs dynamic activities through partial range (50-75%) with Aeronautical engineer  Sit-to-Stand Transfer Independence/Assistance Level  Minimum assist;Assist of 1   Sit-to-Stand Transfer Assist Device  Rolling walker   Stand-to-Sit Transfer Independence/Assistance Level  Minimum assist;Assist of 1   Stand-to-Sit Transfer Assist Device  Rolling walker    Gait Training  Independence/Assistance Level   Moderate assist;Assist of 1   Assistive Device   Rolling walker   Gait Distance  25 feet;x3  Chair follow  Gait Training Comments  Requires assistance advancing RLE manually. Cueing for proper stance with hip and quad activation. Required a few seated rest breaks when mobilizing.     Handoff Documentation  Handoff  Discussed with nursing;Patient in chair    Endurance  Endurance Comments  Fair    AM-PAC - Basic Mobility Screen- How much help from another person do you currently need.....  Turning from your back to your side while in a a flat bed without using rails?  2 - A Lot - Requires a lot of help (maximum to moderate assistance). Can use assistive devices.   Moving from lying on your back to sitting on the side of a flat bed without using bed rails?  1 - Total - Requires total assistance or cannot do it at all.   Moving to and from a bed to a chair (including a wheelchair)?  1 - Total - Requires total assistance or cannot do it at all.   Standing up from a chair using your arms(e.g., wheelchair or bedside chair)?  1 - Total - Requires total assistance or cannot do it at all.   To walk in a hospital room?  1 - Total - Requires total assistance or cannot do it at all.   Climbing 3-5 steps with a railing?  1 - Total - Requires total assistance or cannot do it at all.   AMPAC Mobility Score  7   Highest Level of Mobility TARGET  Mobility Level 2,Turn self in bed/bed activity/dependent transfer     Clinical Impression  Follow up Assessment  Patient unsteady when mobilizing requiring assist x 1 with transfers and ambulation. Currently deconditioned and a fall risk. Most appropriate for acute rehab once medically ready for discharge.    Impairments Found (describe specific impairments)  aerobic capacity/endurance;mobility;balance;muscle strength;muscle performance    Frequency/Equipment Recommendations  PT Frequency  3x per week  Tues   PT Recommendations for Inpatient Admission  Activity/Level of Assist  out of bed;with rolling walker;ambulate;assist of 2   Therapeutic Exercise  ROM as tolerated;encourage exercise program issued    Planned Treatment / Interventions  Plan for Next Visit  progress as tol    PT Discharge Summary  PT Disposition Recommendation(s)  Acute Rehab   Harrell Gave DPT [facility] [phone]

## 2020-01-21 NOTE — Plan of Care
Problem: Adult Inpatient Plan of CareGoal: Readiness for Transition of CareOutcome: Outcome(s) achieved CM Discharge Planning    Most Recent Value Discharge Planning Concerns to be Addressed  adjustment to diagnosis/illness, care coordination/care conferences, discharge planning Patient/Patient Representative was presented with a list of choices of facilities, agencies and/or dme providers  Facility Preference(s), Inpatient Rehab Facility Facility Preference(s)  [facility] Facility Name  [name] Patient is considered homebound due to: (he/she requires considerable and taxing effort to leave their residence for medical reasons or religious services OR infrequently OR of short duration for other reasons)  mobility problems, 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  N/A Medicare IM- signed, dated, timed and scanned, if required  N/A DME Authorized/Delivered  N/A No needs identified/ follow up with PCP/MD  N/A Post acute care services secured W10 complete  Yes Finalized Plan Discharge Disposition  Inpatient Rehab Facility [[facility]] Care Coordination recommendation for Provider under the W10 MD certification statement  [facility]) Post acute care services secured W10 complete  Yes Physician documentation required  AVS (Patient Instructions), Priority Discharge Summary  Plan of Care Overview/ Patient Status    Pt is clinically accepted to [facility] pending Bed assignment.Wife updated of dc plan and is in agreement. Will followEvelyn Schawn Byas RN, BSN. Care Manager 7-7Dept Care managementPhone [phone] AddPer [name], No bed today. Will follow up in am

## 2020-01-21 NOTE — Progress Notes
NV improved with partial return of hip flexors and quad (3-4/5) cont PT. ECF pending. [employer] due to come out end of the week.

## 2020-01-22 MED ORDER — MUPIROCIN 2 % TOPICAL OINTMENT
2 % | Freq: Three times a day (TID) | TOPICAL | 3 refills | 7.00 days | Status: AC
Start: 2020-01-22 — End: ?
  Filled 2020-01-22: qty 22, 7d supply, fill #0

## 2020-01-22 MED ORDER — ACETAMINOPHEN 325 MG TABLET
325 mg | ORAL_TABLET | Freq: Four times a day (QID) | ORAL | 1 refills | 10.00 days | Status: AC | PRN
Start: 2020-01-22 — End: ?
  Filled 2020-01-22: qty 80, 10d supply, fill #0

## 2020-01-22 MED ORDER — NICOTINE 7 MG/24 HR DAILY TRANSDERMAL PATCH
7 mg | MEDICATED_PATCH | Freq: Every day | TRANSDERMAL | 1 refills | 28.00 days | Status: AC
Start: 2020-01-22 — End: 2020-07-20

## 2020-01-22 MED ORDER — ASPIRIN 81 MG CHEWABLE TABLET
81 mg | ORAL_TABLET | Freq: Every day | ORAL | 4 refills | 30.00 days | Status: AC
Start: 2020-01-22 — End: 2021-02-25
  Filled 2020-01-22: qty 120, 30d supply, fill #0

## 2020-01-22 MED ADMIN — ASPIRIN 81 MG CHEWABLE TABLET: 81 mg | ORAL | @ 15:00:00 | Stop: 2020-01-22

## 2020-01-22 MED ADMIN — OXYCODONE IMMEDIATE RELEASE 5 MG TABLET: 5 mg | ORAL | @ 04:00:00 | Stop: 2020-01-22

## 2020-01-22 MED ADMIN — METOPROLOL TARTRATE IMMEDIATE RELEASE 50 MG TABLET: 50 mg | ORAL | @ 02:00:00 | Stop: 2020-01-22

## 2020-01-22 MED ADMIN — SENNOSIDES 8.6 MG-DOCUSATE SODIUM 50 MG TABLET: ORAL | @ 02:00:00 | Stop: 2020-01-22

## 2020-01-22 MED ADMIN — MELATONIN 3 MG TABLET: 3 mg | ORAL | @ 02:00:00 | Stop: 2020-01-22

## 2020-01-22 MED ADMIN — NORTRIPTYLINE 25 MG CAPSULE: 25 mg | ORAL | @ 02:00:00 | Stop: 2020-01-22

## 2020-01-22 MED ADMIN — MULTIVITAMIN WITH FOLIC ACID 400 MCG TABLET: 400 mcg | ORAL | @ 15:00:00 | Stop: 2020-01-22

## 2020-01-22 MED ADMIN — CYCLOBENZAPRINE 5 MG TABLET: 5 mg | ORAL | @ 02:00:00 | Stop: 2020-01-22

## 2020-01-22 MED ADMIN — METOPROLOL TARTRATE IMMEDIATE RELEASE 50 MG TABLET: 50 mg | ORAL | @ 15:00:00 | Stop: 2020-01-22

## 2020-01-22 MED ADMIN — ACETAMINOPHEN 325 MG TABLET: 325 mg | ORAL | @ 02:00:00 | Stop: 2020-01-22

## 2020-01-22 MED ADMIN — HEPARIN (PORCINE) 5,000 UNIT/ML INJECTION SOLUTION: SUBCUTANEOUS | @ 11:00:00 | Stop: 2020-01-22

## 2020-01-22 MED ADMIN — POLYETHYLENE GLYCOL 3350 17 GRAM ORAL POWDER PACKET: 17 gram | ORAL | @ 15:00:00 | Stop: 2020-01-22

## 2020-01-22 MED ADMIN — OXYCODONE IMMEDIATE RELEASE 5 MG TABLET: 5 mg | ORAL | @ 15:00:00 | Stop: 2020-01-22

## 2020-01-22 MED ADMIN — ROSUVASTATIN 40 MG TABLET: 40 mg | ORAL | @ 15:00:00 | Stop: 2020-01-22

## 2020-01-22 MED ADMIN — ACETAMINOPHEN 325 MG TABLET: 325 mg | ORAL | @ 11:00:00 | Stop: 2020-01-22

## 2020-01-22 MED ADMIN — SENNOSIDES 8.6 MG-DOCUSATE SODIUM 50 MG TABLET: ORAL | @ 15:00:00 | Stop: 2020-01-22

## 2020-01-22 MED ADMIN — HEPARIN (PORCINE) 5,000 UNIT/ML INJECTION SOLUTION: SUBCUTANEOUS | @ 02:00:00 | Stop: 2020-01-22

## 2020-01-22 MED ADMIN — OXYCODONE IMMEDIATE RELEASE 5 MG TABLET: 5 mg | ORAL | @ 11:00:00 | Stop: 2020-01-22

## 2020-01-22 NOTE — Plan of Care
Problem: Adult Inpatient Plan of CareGoal: Readiness for Transition of CareOutcome: Outcome(s) achieved CM Finalized D/C Plan    Most Recent Value Finalized Plan Discharge Disposition  Inpatient Rehab Facility [[facility]] Care Coordination recommendation for Provider under the W10 MD certification statement  [facility]) Post acute care services secured W10 complete  Yes Physician documentation required  AVS (Patient Instructions), Priority Discharge Summary CM D/C Readiness PASRR completed and approved  N/A Authorization number obtained, if required  N/A Is there a 3 day INPATIENT Qualifying stay for Medicare Patients?  N/A Medicare IM- signed, dated, timed and scanned, if required  N/A DME Authorized/Delivered  N/A No needs identified/ follow up with PCP/MD  N/A Post acute care services secured W10 complete  Yes  Plan of Care Overview/ Patient Status    Per [name], they have ready bed on [address] and pt can be discharged. Pt's wife updated. RN and Team notified. W-10 updated. Will dc via stretcher.Conley Canal RN, BSN. Care Manager 7-7Dept Care managementPhone [phone]

## 2020-01-22 NOTE — Plan of Care
Plan of Care Overview/ Patient Status    Problem: Adult Inpatient Plan of CareGoal: Plan of Care ReviewOutcome: Outcome(s) achievedGoal: Patient-Specific Goal (Individualized)Outcome: Outcome(s) achievedGoal: Absence of Hospital-Acquired Illness or InjuryOutcome: Outcome(s) achievedGoal: Optimal Comfort and WellbeingOutcome: Outcome(s) achievedGoal: Readiness for Transition of CareOutcome: Outcome(s) achieved Problem: Skin Injury Risk IncreasedGoal: Skin Health and IntegrityOutcome: Outcome(s) achieved Problem: Fall Injury RiskGoal: Absence of Fall and Fall-Related InjuryOutcome: Outcome(s) achieved Problem: WoundGoal: Optimal Wound HealingOutcome: Outcome(s) achieved Problem: Bleeding (Spinal Surgery)Goal: Absence of BleedingOutcome: Outcome(s) achieved Problem: Bowel Elimination Impaired (Spinal Surgery)Goal: Effective Bowel EliminationOutcome: Outcome(s) achieved Problem: Functional Ability Impaired (Spinal Surgery)Goal: Optimal Functional AbilityOutcome: Outcome(s) achieved Problem: Infection (Spinal Surgery)Goal: Absence of Infection Signs and SymptomsOutcome: Outcome(s) achieved Problem: Neurologic Impairment (Spinal Surgery)Goal: Optimal Neurologic FunctionOutcome: Outcome(s) achieved Problem: Ongoing Anesthesia Effects (Spinal Surgery)Goal: Anesthesia/Sedation RecoveryOutcome: Outcome(s) achieved Problem: Pain (Spinal Surgery)Goal: Acceptable Pain ControlOutcome: Outcome(s) achieved Problem: Postoperative Nausea and Vomiting (Spinal Surgery)Goal: Nausea and Vomiting ReliefOutcome: Outcome(s) achieved Problem: Postoperative Urinary Retention (Spinal Surgery)Goal: Effective Urinary EliminationOutcome: Outcome(s) achieved Problem: InfectionGoal: Infection Symptom ResolutionOutcome: Outcome(s) achieved Derek Hudson was discharged via ambulance accompanied by Alone. Verbalized understanding of discharge instructionsand recommended follow up care as per the after visit summary.  Written discharge instructions provided. Denies any further questions. Vital signs    Vitals:  01/21/20 2017 01/21/20 2359 01/22/20 0506 01/22/20 0826 BP: (!) 145/81 109/78 127/82 133/77 Pulse: (!) 105 (!) 111 (!) 98 (!) 98 Resp:  17 17 18  Temp:  98 ?F (36.7 ?C) 97.7 ?F (36.5 ?C) 97.7 ?F (36.5 ?C) TempSrc:  Oral Oral Oral SpO2:  94% 95% 95% Weight:     Height:

## 2020-01-22 NOTE — Plan of Care
Plan of Care Overview/ Patient Status    1900 - 0700Patient is alert and oriented x4. Vital signs stable, ongoing pain to neck, lower back and  generalized pain. POC reviewed with patient, call light within reach. Pt refused IV fluids and PO fluid increase encouraged, also refused Telemetry (none in place prior to now). Team made aware. Neck circumference 52 cm. Medications administered as ordered. Patient slept moderately over night. Continent of bowel and bladder. Awaiting DC when bed available. No complains or acute events over night.

## 2020-01-24 NOTE — Discharge Summary
[facility]	Stroke Service Discharge SummaryPatient Data:  [name] Age: 64 y.o. DOB: 06/26/1956	 MRN: [id number]	 Admit date: 1/21/2021Discharge date: 2/3/21Admitting Attending Physician: Derek Boll, MD  Discharge Attending Physician: Derek Saxon MD PCP: Derek Hudson, DOConsultants:  Neurosurgery, Orthopedic surgery, Internal Medicine, CardiologyPrincipal Diagnosis: S/P lumbar laminectomyOther Diagnosis:Acute Ischemic stroke, bilateral strokesLeft Internal Carotid Stenosis, severeRight Internal Carotid Stenosis, severeDischarged Condition: fairDischarge Modified Rankin Scale: Rankin Scale: 4 -->Moderately severe disability, unable to walk without assistance and unable to attend to own bodily needs without assistanceDisposition: Home Allergies No Known Allergies Hospital Course: HPI: 64 yo M w/ PMH s/o MI, CAD, HTN and active smoker on daily ASA, lumbar stenosis w/ chronically weak L leg s/p L3-5 decompressive lumbar laminectomy (01/09/20) and hematoma evacuation/washout (01/10/20) that presented as a stroke code from Derek Hudson on POD#1 for confusion, and significant R leg weakness.Overall time last normal is difficult to say, maybe 3 days ago when he had his first surgery; he was found abnormal this AM, so it's also possible time last normal was last night.He was found to have multiple strokes on MRI and a L>R ICA stenosis. Patient decline MR perfusion given his claustrophobia despite offer of Ativan.  Pateint was admitted to [facility] Stroke SDU 1/24 for further evaluation and management with NSG and Ortho closely following.?Aspirin had been held 5 days prior to surgery and had not yet been restarted. He was on SCDs for DVT proph.  Perioperatively home meds: ACEI and ASA were held.  ASA resumed on 1/23.Admission NIHSS: 8Preadmission modified Rankin Score: 2Hospital Course and Work-up:Surgical Course:[name] who was admitted to [facility] on 01/10/2020 after undergoing a L3-5 decompressive lumbar laminectomy.  Surgeon: Dr. Hosie Hudson, Derek Hudson, MDDate of Surgery: 01/09/2020, 1/22/2021Intra-Op Complications: None Acute events in the during the hospital stay: POD#1 (01/11/20), on exam no motors present to RLE, taken back to OR for lumbar washout I&D and hematoma evacuation. Hemovac drains removed 1/25.Consults: None Bladder Function Concerns: VoidsDVT Prophylaxis: No chemical DVT ppx, SCD's, ambulation, resume home ASA on 1/23Home Medication Changes: Held lisinopril/ASA perioperativelyWeight Bearing Status: WBAT - avoid bending, lifting or twisting. Brace prn. Cleared for mobilization/working with PT on 1/26.During admission on Stroke service, NSG was consulted to discuss possible intervention for bilateral carotid stenosis.  Carotid Duplex US for surgical planning was done on 1/25 which confirm complete occlusion of L ICA (nonintervenable), and severe stenosis of R ICA.  Internal Medicine and Cardiology were consulted for medical clearance for surgery.  There was concern for thrombus in ICA, but deferred anticoagulation with heparin gtt given recent surgery and complication by hematoma.  Treated with high dose aspirin with Heparin SC for DVT prophy. Patient's exam remained stable with chronic LLE weakness (distal > proximal) and post-stroke plegia of RLE while awaiting R CEA with Neurosurgery on Thursday `/28/21.  Post-op he was admitted to the NICU.On 1/28 he underwent  R CEA for carotid revascularization which he recovered well from and was transferred back to the stroke floor.?Patient remained stable after R CEA.  He had an episode of AMS and focal weakness on 1/31, thus EEG was pursued to evaluate for seizures secondary to his stokes.  EEG was negative for epileptiform activity or seizures.  His exam returned to baseline by 2/1.  He was accepted to [facility] on 2/1 and discharged on 2/2.Etiology of stroke (proposed mechanism): Likely watershed iso severe bilateral ICA stenosis in immediate post-op period vs atheroembolism  of severe atherosclerosis of bilateral carotid arteriesRisk factors for stroke: HLD, HTN, Age, GenderAntiplatelet/anticoagulation started/continued? Resumed home aspirin 81mg  daily, increased to 325mg  daily. Deferred Heparin gtt given recent surgery c/b hematoma. No plavix.Statin started/continued? Rosuvastatin 40mg Pertinent lab findings:Stroke Labs:Lab Most recent values Ha1C     Lab Results Component Value Date ? HGBA1C 6.2 (H) 12/26/2019 ? Cholesterol Lipid Panel:    Lab Results Component Value Date ? CHOL 165 01/12/2020 ? TRIG 112 01/12/2020 ? HDL 71 01/12/2020 ? LDL 72 01/12/2020 ? LDL 80 01/12/2020 ? INR     Lab Results Component Value Date ? INR 0.95 01/11/2020 ? TSH     Lab Results Component Value Date ? TSH 0.136 (L) 01/12/2020 ? TSH3G 0.68 01/31/2019 ? Pertinent imaging findings:Head ImagingCarotid Duplex US Bilateral 1/25/21IMPRESSION: - Findings are 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 The plaque appears extremely hypoechoic and thrombus is not excluded. Similar findings were noted on prior CTA. - Doppler criteria have been correlated with the NASCET method of measuring an internal carotid artery stenosis. ?MRI Brain W Wo Iv Contrast 1/23/21IMPRESSION: Multifocal small patchy areas of acute ischemia/infarct within bilateral ACA and MCA territories, likely embolic in etiology. ?No significant mass effect.?CTA Head/Neck 1/23/21IMPRESSION:1. ?Complete occlusion of the cervical left internal carotid artery, age-indeterminate. Thromboembolism versus dissection considered. Near-complete occlusion of the right proximal internal carotid artery at the origin. More superior contrast filling may be retrograde flow through the circle of Willis.2. ?No proximal vessel cut off, significant stenosis, or aneurysm on CTA head. Specifically, no discrete vessel cut off associated with areas of probable infarct seen on concurrent noncontrast [place] head. Moderate intracranial atherosclerotic disease.3. ?Consider MRI of the brain for characterization of any infarct. Consider MRA of the neck with and without contrast for delineation of any dissection, if warranted. ?CTH  1/23/21IMPRESSION:1. ?Small scattered infarcts (transcortical hypodensity) at the left greater than right posterior superior frontal lobes. These may relate to watershed and/or embolic etiologies. MRI is recommended to help further determine the chronicity of these findings.2. ?No acute intracranial hemorrhage.3. ?See report of concurrent CTA head and neck for further findings.?Cardiac ImagingTTE 01/01/20?* Sinus tachycardia noted at rest.* Normal left ventricular size, thickness, systolic function and wall motion. LVEF calculated by biplane Simpson's was 57%.* Normal right ventricular cavity size and systolic function. ?Estimated right ventricular systolic pressure is 20 mmHg. ?Right ventricular systolic pressure is normal.* Thickened mitral valve. ?Trace mitral regurgitation.* Trace tricuspid regurgitation.* Compared to prior echocardiogram dated, 02/06/2019, there is no significant change.Discharge vitals: Blood pressure 133/77, pulse (!) 98, temperature 97.7 ?F (36.5 ?C), temperature source Oral, resp. rate 18, height 5' 9.5 (1.765 m), weight 87.9 kg, SpO2 95 %.Discharge NIHSS: 2Discharge modified Rankin Score: 4Discharge Physical Exam:General: Well-appearing, sitting comfortably in bed, NADHEENT: Sclera anicteric, mmm, o/p clearNeck: Supple, full ROM, no LAD, non-tenderCardiac: RRR, no m/r/gPulm: CTAB, no w/r/rAbdomen: Soft, NTND, NABSExtremities: 2+ radial and DP pulses, no c/c/e?Neuro:MS- AO to self, date/year, YNHH, situation. Naming, repetition, comprehension intact. Speech clear and fluent. CN- PERRL 4mm to 2mm, VFF, EOMI no nystagmus, V1-V3 intact to LT, facial movements symmetric, TUP midline, SCM/traps 5/5Motor- Normal bulk and tone, no abnormal movements, no PND, mild weakness with orbiting right arm--RUE: 5/5 throughout--RLE: 3/5 hip flexion, 2/5 knee flexion, dorsiflexion and plantar flexion, no spasticity--LUE: 5/5 throughout--LLE:  3/5 at hip, 3/5 at knee, 0/5 at ankle/toesCoordination-  FNF intact without dysmetria bilaterallyReflexes- 3+ in RLE, 2+ otherwiseSensory- Intact to LT  UE and LE b/l throughout.Gait- deferredPending Labs and Tests: Pending Lab Results   Order Current Status  Calcium Collected (01/18/20 0504)  Issues to be addressed post discharge:1. F/u with NSG Dr. Sarita Haver in on 02/28/20 (3-4wks) for repeat Carotid US.2. F/u with stroke neurology.3. Orthopedic spine follow up with Dr. Hosie Hudson will need to be rescheduled (missed due to hospitalization).4. Uptitration of metoprolol for better rate control, targeting rates of 70-80Antiplatelet/Anticoagulation plans: Aspirin 325mg  qdAdditional diagnostic work-up: NoneExtended cardiac monitoring (y/n): NoRepeat imaging: Carotid US in 3 monthsRestrictions: NoneDischarge Medications: Current Discharge Medication List  START taking these medications  Details acetaminophen (TYLENOL) 325 mg tablet Take 2 tablets (650 mg total) by mouth every 6 (six) hours as needed for up to 10 days.Qty: 80 tablet, Refills: 0Start date: 01/22/2020, End date: 02/01/2020  nicotine (NICODERM CQ) 7 mg transdermal patch Place 1 patch (7 mg total) onto the skin daily. Alternate arms/sites when applying patch. Discard old patch when applying new oneQty: 28 patch, Refills: 0Start date: 01/23/2020   CONTINUE these medications which have CHANGED  Details aspirin 81 mg chewable tablet Take 4 tablets (324 mg total) by mouth daily.Qty: 120 tablet, Refills: 3Start date: 01/22/2020  mupirocin (BACTROBAN) 2 % ointment Apply topically 3 (three) times daily for 7 days.Qty: 22 g, Refills: 2Start date: 01/22/2020, End date: 01/29/2020   CONTINUE these medications which have NOT CHANGED  Details atorvastatin (LIPITOR) 20 mg tablet Take 1 tablet (20 mg total) by mouth daily.Qty: 90 tablet, Refills: 0  lisinopriL (PRINIVIL,ZESTRIL) 10 mg tablet TAKE 1 TABLET BY MOUTH EVERY DAYQty: 90 tablet, Refills: 3  metoprolol succinate (TOPROL-XL) 100 mg XL 24 hr extended release tablet TAKE 1 TABLET (100 MG TOTAL) BY MOUTH DAILY.Marland Kitchen TAKE WITH OR IMMEDIATELY FOLLOWING A MEAL.Qty: 90 tablet, Refills: 3  nortriptyline (PAMELOR) 25 mg capsule Take 3 capsules (75 mg total) by mouth nightly.Qty: 270 capsule, Refills: 3  Associated Diagnoses: Intractable chronic post-traumatic headache  oxyCODONE-acetaminophen (PERCOCET) 10-325 mg per tablet Take 1 tablet by mouth every 6 (six) hours as needed.  chlorhexidine gluconate 2 % Liqd Apply 1 Application topically.  cyclobenzaprine (FLEXERIL) 10 mg tablet TAKE 1 TABLET BY MOUTH TWICE A DAY AS NEEDED FOR MUSCLE SPASMSQty: 30 tablet, Refills: 0  ibuprofen (ADVIL,MOTRIN) 800 mg tablet TAKE 1 TABLET BY MOUTH TWICE A DAY AS NEEDED  JUBLIA 10 % topical solution Apply 1 Application topically daily.Qty: 8 mL, Refills: prn   Follow-up Information:[place], Derek Hudson, MD[address] [place] [phone] 01/20/2020@ 11:45 AMMatouk, Carlyle Dolly, MD[address] [facility] [place] [phone] 1 monthwith carotid [facility] Neurology Stroke at [address] [place] [phone] on 2/24/2021An appointment Hudson been made for you with the [facility] Stroke Neurology Team on 02/12/2020 at 2:45PM with Dr. Patience Musca at the [facility] location.[facility] [address] [place] [phone] on 3/4/2021An appointment Hudson been rescheduled for you with Dr. Greer Ee on 02/20/2020 at 1:00PM. [address] [phone] PMH PSH Past Medical History: Diagnosis Date ? Active smoker  ? Acute myocardial infarction [facility] Code) 1992  Silent heart attack/IWMI ? Arthritis   hands, knees,and shoulders ? CHF (congestive heart failure) (HC Code)   CHRONIC SYSTOLIC CHF ? Chronic neck pain  ? Chronic systolic heart failure, LVEF 40-45% 02/09/2015 ? Colon polyp  ? Headache  ? Hypercholesteremia  ? Hypertension  ? Lumbar pain   Past Surgical History: Procedure Laterality Date ? COLONOSCOPY   ? SHOULDER SURGERY   ? WRIST SURGERY  titanium  Social History Family History Social History Tobacco Use ? Smoking status: Current Every Day Smoker   Packs/day: 1.50   Years: 35.00   Pack years: 52.50 ? Smokeless tobacco: Never Used Substance Use Topics ? Alcohol use: Yes   Comment: OCCASIONAL 1- 2 drinks per week  Family History Problem Relation Age of Onset ? Cancer Mother       lung ? Cancer Father       colon ? Colon cancer Father  ? Colon polyps Sister  ? Migraines Sister  ? Hypertension Maternal Uncle  ? Hypertension Maternal Uncle   Concepcion Elk, MDYale Internal Medicine, PGY-1MHB: [phone] : I have seen and examined the patient on their discharge day. ?I have reviewed, edited and agree with the discharge summary.

## 2020-01-30 ENCOUNTER — Encounter: Admit: 2020-01-30 | Payer: PRIVATE HEALTH INSURANCE | Attending: Medical | Primary: Urgent Care

## 2020-02-07 ENCOUNTER — Telehealth: Admit: 2020-02-07 | Payer: PRIVATE HEALTH INSURANCE | Primary: Urgent Care

## 2020-02-07 NOTE — Telephone Encounter
Consuella Lose, [place] at [facility] called to say Derek Hudson has been having heme positive stools. His HCT remains low at 25. He is currently on aspirin 325 mg and is receiving subcutaneous heparin injections. She is asking if his anticoagulant medications can be adjusted. Told her we would call her back with response from Dr. Sarita Haver.

## 2020-02-12 ENCOUNTER — Encounter: Admit: 2020-02-12 | Payer: PRIVATE HEALTH INSURANCE | Attending: Neurology | Primary: Urgent Care

## 2020-02-19 MED ORDER — ATORVASTATIN 20 MG TABLET
20 | ORAL_TABLET | ORAL | 1 refills | 90.00000 days | Status: AC
Start: 2020-02-19 — End: 2020-08-06

## 2020-02-20 ENCOUNTER — Encounter: Admit: 2020-02-20 | Payer: PRIVATE HEALTH INSURANCE | Attending: Medical | Primary: Urgent Care

## 2020-02-26 ENCOUNTER — Encounter: Admit: 2020-02-26 | Payer: PRIVATE HEALTH INSURANCE | Attending: Neurological Surgery | Primary: Urgent Care

## 2020-02-28 ENCOUNTER — Encounter: Admit: 2020-02-28 | Payer: PRIVATE HEALTH INSURANCE | Attending: Neurological Surgery | Primary: Urgent Care

## 2020-02-28 ENCOUNTER — Ambulatory Visit: Admit: 2020-02-28 | Payer: PRIVATE HEALTH INSURANCE | Primary: Urgent Care

## 2020-03-04 ENCOUNTER — Ambulatory Visit: Admit: 2020-03-04 | Payer: PRIVATE HEALTH INSURANCE | Primary: Urgent Care

## 2020-03-04 ENCOUNTER — Encounter: Admit: 2020-03-04 | Payer: PRIVATE HEALTH INSURANCE | Attending: Neurological Surgery | Primary: Urgent Care

## 2020-03-04 ENCOUNTER — Inpatient Hospital Stay: Admit: 2020-03-04 | Discharge: 2020-03-04 | Payer: PRIVATE HEALTH INSURANCE | Primary: Urgent Care

## 2020-03-04 DIAGNOSIS — I6521 Occlusion and stenosis of right carotid artery: Secondary | ICD-10-CM

## 2020-03-09 ENCOUNTER — Telehealth: Admit: 2020-03-09 | Payer: PRIVATE HEALTH INSURANCE | Attending: Neurological Surgery | Primary: Urgent Care

## 2020-03-09 ENCOUNTER — Ambulatory Visit: Admit: 2020-03-09 | Payer: PRIVATE HEALTH INSURANCE | Primary: Urgent Care

## 2020-03-09 NOTE — Telephone Encounter
YM CARE CENTER MESSAGETime of call:   11:52 AMCaller:   CindyCaller's relationship to patient:  Sister  Calling from (pharmacy, hospital, agency, etc.):  n/a   Reason for call:   Looking to see if a phone call or meeting can be set up with patient, wife and sister to discuss his status and plan of care If not feeling well, what are symptoms:  n/a   If having symptoms, how long have the symptoms been present:  n/a   Best telephone number for callback:   [phone] Best time to return call:   any Permission to leave message:  yes   [facility] Referral Specialist

## 2020-03-09 NOTE — Telephone Encounter
Returned call to [place], patients sister can come in during office visit this Wednesday and phone call the others.

## 2020-03-11 ENCOUNTER — Encounter: Admit: 2020-03-11 | Payer: PRIVATE HEALTH INSURANCE | Attending: Neurological Surgery | Primary: Urgent Care

## 2020-03-11 ENCOUNTER — Ambulatory Visit: Admit: 2020-03-11 | Payer: PRIVATE HEALTH INSURANCE | Attending: Neurological Surgery | Primary: Urgent Care

## 2020-03-11 DIAGNOSIS — K635 Polyp of colon: Secondary | ICD-10-CM

## 2020-03-11 DIAGNOSIS — I5022 Chronic systolic (congestive) heart failure: Secondary | ICD-10-CM

## 2020-03-11 DIAGNOSIS — I10 Essential (primary) hypertension: Secondary | ICD-10-CM

## 2020-03-11 DIAGNOSIS — I219 Acute myocardial infarction, unspecified: Secondary | ICD-10-CM

## 2020-03-11 DIAGNOSIS — I509 Heart failure, unspecified: Secondary | ICD-10-CM

## 2020-03-11 DIAGNOSIS — M542 Cervicalgia: Secondary | ICD-10-CM

## 2020-03-11 DIAGNOSIS — F17200 Nicotine dependence, unspecified, uncomplicated: Secondary | ICD-10-CM

## 2020-03-11 DIAGNOSIS — I6521 Occlusion and stenosis of right carotid artery: Secondary | ICD-10-CM

## 2020-03-11 DIAGNOSIS — E7800 Pure hypercholesterolemia, unspecified: Secondary | ICD-10-CM

## 2020-03-11 DIAGNOSIS — M1990 Unspecified osteoarthritis, unspecified site: Secondary | ICD-10-CM

## 2020-03-11 DIAGNOSIS — I6522 Occlusion and stenosis of left carotid artery: Secondary | ICD-10-CM

## 2020-03-11 DIAGNOSIS — R519 Headache: Secondary | ICD-10-CM

## 2020-03-11 DIAGNOSIS — M545 Lumbar pain: Secondary | ICD-10-CM

## 2020-03-11 NOTE — Progress Notes
[name] of Birth: 1957/03/13Thank you for referring Derek Hudson to my neurovascular clinic in follow-up after a R-sided CAROTID ENDARTERECTOMY (CEA). Derek Hudson arrived today in clinic in a transport wheelchair. He was accompanied by his daughter.Briefly, this 64 year old man presented with bi-hemispjheric strokes after recent spine surgery. A [facility] angiogram demonstrated a symptomatic, high-grade right carotid stenosis and contralateral (left) carotid occlusion.?He was referred by Vascular Neurology for right-sided carotid revascularization for secondary stroke prevention. He underwent a R-sided CEA on 16-Jan-2020. His surgery was uneventful.On direct questioning, Derek Hudson continues to make a good clinical recovery. He has persistent lower extremity and R-sided weakness. These are all improving. He has not had a recurrent stroke or stroke-like symptom. Current Outpatient Medications: ?  aspirin, 324 mg, Oral, Daily?  atorvastatin, TAKE 1 TABLET BY MOUTH EVERY DAY?  chlorhexidine gluconate, Apply 1 Application topically.?  cyclobenzaprine, TAKE 1 TABLET BY MOUTH TWICE A DAY AS NEEDED FOR MUSCLE SPASMS?  ibuprofen, TAKE 1 TABLET BY MOUTH TWICE A DAY AS NEEDED?  Jublia, 1 Application, Topical (Top), Daily?  lisinopriL, TAKE 1 TABLET BY MOUTH EVERY DAY?  metoprolol succinate XL, 100 mg, Oral, Daily (Patient taking differently: 100 mg, Oral, Every Morning, Take with or immediately following a meal.)?  nicotine, 1 patch, Transdermal, Daily?  nortriptyline, 75 mg, Oral, Nightly?  oxyCODONE-acetaminophen, 1 tablet, Oral, Q6H PRNAllergies: He has No Known Allergies.Review of SystemsI have reviewed the review of systems, as noted by the clinical support staff.On physical examination, BP 132/82 (Site: l a, Position: Sitting, Cuff Size: Large)  - Pulse (!) 92  - Temp 97.6 ?F (36.4 ?C) (Temporal)  - Ht (P) 5' 9 (1.753 m)  - Wt (P) 93.9 kg  - SpO2 98%  - BMI (P) 30.57 kg/m?   A/Ox3, NAD, PERL, mild R-sided weakness and bilateral lower extremity weakness, Imaging: I have personally reviewed a carotid ultrasound performed 03/04/2020 that demonstrates .Marland Kitchen.1.  There is no hemodynamically significant stenosis in the right carotid artery status post endarterectomy. 2.  Unchanged occlusion of the left ICA.Assessment/Plan: Overall, I am very pleased with his clinico-radiographic progress-to-date. I will arrange for him to have a repeat carotid ultrasound x 64-months at [facility] and TeleHealth Visit to review the results. Thank you for entrusting me with the care of your patient. Please do not hesitate to contact me directly with any questions or concerns. Sincerely,CharlesCharles Maylani Embree MD FRCS(C)Vice Chair [facility] Department of NeurosurgerySection Chief of Firefighter, NPH and Disorders of CSF Dynamics ProgramYale University / St [facility] Shore HospitalElectronically signed ZO:[id number] Ina Kick, MD

## 2020-03-12 ENCOUNTER — Telehealth: Admit: 2020-03-12 | Payer: PRIVATE HEALTH INSURANCE | Attending: Neurological Surgery | Primary: Urgent Care

## 2020-03-12 NOTE — Telephone Encounter
-----   Message from Lorelle Gibbs, RN sent at 03/12/2020  2:38 PM EDT -----Regarding: RE: [name] with OVI believe the care center obtains authorization for imaging when they schedule. Please schedule as stated below. Thanks----- Message -----From: Chiquita Loth: 03/11/2020   6:08 PM EDTTo: Lorelle Gibbs, RN, #Subject: RE: [name] with OV                       Per patient he would like US done at [facility] Please obtain auth and advise [place] when its all set to schedule.Thank you----- Message -----From: Lorelle Gibbs, RNSent: 03/11/2020   4:55 PM EDTTo: [facility] Neurosurgery SchedulingSubject: [name] with OV                           Provider:MatoukExpected date: 2 monthsType of appointment: tele-healthImaging to schedule prior: Carotid US (MUST be done prior)Imaging needed from [facility] (imaging to be pushed/reports scanned): n/aAppointment Notes (should be copied into the appointment notes): follow up CEA, results of CUS

## 2020-03-12 NOTE — Telephone Encounter
Hello, Please obtain auth for this patient to have:US DUPLEX CAROTID BILATERAL COMPLETEAdvanced Radiology [address]Shelton, [place] [address]WU: [phone] ID: [id number] Thank you!

## 2020-04-14 ENCOUNTER — Telehealth: Admit: 2020-04-14 | Payer: PRIVATE HEALTH INSURANCE | Attending: Neurological Surgery | Primary: Urgent Care

## 2020-04-14 NOTE — Telephone Encounter
Called rehab- patient is ambulatory, will stop heparin sq at this time. If his condition changes and he is no longer ambulatory we will need to resume AC.

## 2020-04-14 NOTE — Telephone Encounter
.  YM CARE CENTER MESSAGETime of call:   8:43 AMCaller:   MichaelCaller's relationship to patient:  self  Calling from (pharmacy, hospital, agency, etc.):  n/a   Reason for call:   Pt calling to ask Dr Sarita Haver if he can be taken off the blood thinner. He stated Dr Hosie Poisson said he should really contact his doctor that did the surgery to have him take him off the blood thinner. If not feeling well, what are symptoms:  n/a   If having symptoms, how long have the symptoms been present:  n/a   Best telephone number for callback:   [phone] Best time to return call:   any Permission to leave message:  yes   [facility]

## 2020-04-14 NOTE — Telephone Encounter
Patient calling back to speak to Lauren, states he hasnt been at [facility] for over a month so he is unsure who she spoke to. Would like a call back from Lauren before 5pm today. Please call [phone].

## 2020-04-14 NOTE — Telephone Encounter
Pt called back stating that he is no longer at [facility] [place]. He is currently at [facility] so contact them if need be. Please advise

## 2020-04-14 NOTE — Telephone Encounter
Spoke with patient- order was called to nurse at [facility]. Pt aware and appreciative.

## 2020-04-14 NOTE — Telephone Encounter
Returned call to pt. He wants to know if he can be taken off the blood thinners. He states he currently gets 2 injections per day. He is unsure if it is heparin or Lovenox. I advised him I would speak with our NP Danis for a decision and she may reach out to the APP at [facility] where he currently is. He verbalized an understanding and was amicable to the plan. I encouraged him to call with any further questions or concerns.

## 2020-04-14 NOTE — Telephone Encounter
Patient is calling back stating that he needs and answer before, 4 pm. That is when he is scheduled to get his next shot. Patient says the easiest way to contact him is by cell phone and he will give the phone to a nurse.

## 2020-04-20 ENCOUNTER — Telehealth: Admit: 2020-04-20 | Payer: PRIVATE HEALTH INSURANCE | Attending: Neurological Surgery | Primary: Urgent Care

## 2020-04-20 ENCOUNTER — Telehealth: Admit: 2020-04-20 | Payer: PRIVATE HEALTH INSURANCE | Attending: Medical | Primary: Urgent Care

## 2020-04-20 NOTE — Telephone Encounter
YM CARE CENTER MESSAGETime of call:   9:06 AMCaller:   MichaelCaller's relationship to patient:  n/a  Calling from [employer], hospital, agency, etc.):  n/a   Reason for call:  Pt is calling to see if he is able drive now would like to speak to either Dr.[name] or nurse says he is walking and feeling better . Pt would like to know if he is cleared to drive  as soon as possible since pts states he would like to take a trip this coming Mothers Day weekend.If not feeling well, what are symptoms:  n/a   If having symptoms, how long have the symptoms been present:  n/a   Best telephone number for callback:   [phone] Best time to return call:   anyPermission to leave message:  yes   Thank you,Kayna Suppa [facility]

## 2020-04-20 NOTE — Telephone Encounter
YM CARE CENTER MESSAGETime of call:   3:53 PMCaller:  MichaelCaller's relationship to patient:  n/a  Calling from [employer], hospital, agency, etc.):  n/a   Reason for call:   Patient needs a f/u appt to be cleared to drive. Patient would like to know if a Telehealth is fine or if F2F is needed. Patient can not wait until June for appt. If not feeling well, what are symptoms:  n/a   If having symptoms, how long have the symptoms been present:  n/a   Best telephone number for callback:   [phone] Best time to return call:   anytime Permission to leave message:  yes   [facility]

## 2020-04-20 NOTE — Telephone Encounter
My December note indicates he was suppose to return for follow up with Dr. Michaell Cowing in 2 months.  He needs to have a F2F with Dr. Michaell Cowing for his appointment. thanks

## 2020-04-20 NOTE — Telephone Encounter
Patient needs to be seen by neurology- on discharge summary he was listed as mRankin 4- severely disabled. Not comfortable clearing to drive without neurology input

## 2020-04-21 NOTE — Telephone Encounter
Contacted pt. Advised him that neurology has to clear him to drive. He verbalized understanding and was amicable to the plan. Encouraged him to call with any further questions or concerns.

## 2020-04-22 ENCOUNTER — Telehealth: Admit: 2020-04-22 | Payer: PRIVATE HEALTH INSURANCE | Attending: Neurology | Primary: Urgent Care

## 2020-04-22 NOTE — Telephone Encounter
LVM 1x to schedule follow up with Dr. Michaell Cowing in office.

## 2020-04-22 NOTE — Telephone Encounter
LVM to schedule follow up in office with Dr. Michaell Cowing.

## 2020-05-11 ENCOUNTER — Ambulatory Visit: Admit: 2020-05-11 | Payer: PRIVATE HEALTH INSURANCE | Primary: Urgent Care

## 2020-05-14 ENCOUNTER — Encounter: Admit: 2020-05-14 | Payer: PRIVATE HEALTH INSURANCE | Attending: Neurological Surgery | Primary: Urgent Care

## 2020-05-15 ENCOUNTER — Telehealth: Admit: 2020-05-15 | Payer: PRIVATE HEALTH INSURANCE | Attending: Neurology | Primary: Urgent Care

## 2020-05-15 ENCOUNTER — Ambulatory Visit: Admit: 2020-05-15 | Payer: PRIVATE HEALTH INSURANCE | Attending: Neurology | Primary: Urgent Care

## 2020-05-15 ENCOUNTER — Encounter: Admit: 2020-05-15 | Payer: PRIVATE HEALTH INSURANCE | Attending: Neurology | Primary: Urgent Care

## 2020-05-15 DIAGNOSIS — G834 Cauda equina syndrome: Secondary | ICD-10-CM

## 2020-05-15 DIAGNOSIS — I509 Heart failure, unspecified: Secondary | ICD-10-CM

## 2020-05-15 DIAGNOSIS — M542 Cervicalgia: Secondary | ICD-10-CM

## 2020-05-15 DIAGNOSIS — I63233 Cerebral infarction due to unspecified occlusion or stenosis of bilateral carotid arteries: Secondary | ICD-10-CM

## 2020-05-15 DIAGNOSIS — I639 Cerebral infarction, unspecified: Secondary | ICD-10-CM

## 2020-05-15 DIAGNOSIS — R519 Headache: Secondary | ICD-10-CM

## 2020-05-15 DIAGNOSIS — I63231 Cerebral infarction due to unspecified occlusion or stenosis of right carotid arteries: Secondary | ICD-10-CM

## 2020-05-15 DIAGNOSIS — I5022 Chronic systolic (congestive) heart failure: Secondary | ICD-10-CM

## 2020-05-15 DIAGNOSIS — F17200 Nicotine dependence, unspecified, uncomplicated: Secondary | ICD-10-CM

## 2020-05-15 DIAGNOSIS — K635 Polyp of colon: Secondary | ICD-10-CM

## 2020-05-15 DIAGNOSIS — I10 Essential (primary) hypertension: Secondary | ICD-10-CM

## 2020-05-15 DIAGNOSIS — M1990 Unspecified osteoarthritis, unspecified site: Secondary | ICD-10-CM

## 2020-05-15 DIAGNOSIS — E7800 Pure hypercholesterolemia, unspecified: Secondary | ICD-10-CM

## 2020-05-15 DIAGNOSIS — I219 Acute myocardial infarction, unspecified: Secondary | ICD-10-CM

## 2020-05-15 DIAGNOSIS — M545 Lumbar pain: Secondary | ICD-10-CM

## 2020-05-15 NOTE — Progress Notes
The following is the transcribed Review of Systems completed by the patient at intake.Review of Systems Musculoskeletal: Positive for back pain. Neurological: Positive for dizziness and headaches.

## 2020-05-15 NOTE — Telephone Encounter
Received a call form Morrie Sheldon regarding Derek Julia BrancucioPatient was seen in the office and  gave them the AVS and told them that he need to go to [place] to do some test to evaluate driving.She called [name] and they dont do such testing. They received the referral for the therapy but according to her they also do rehab in their facility so why the patient need to go to [place]?And also she wants to know where do they want them to send the patient for the driving evaluation.Best number to reach [place]: [phone]
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
## 2020-05-15 NOTE — Telephone Encounter
Left message for patient to call back so that we can give him information for Driving evaluation.[employer]  ZOXWR#[phone] have 2 offices/ [place] and [place]

## 2020-05-21 ENCOUNTER — Telehealth: Admit: 2020-05-21 | Payer: PRIVATE HEALTH INSURANCE | Attending: Neurology | Primary: Urgent Care

## 2020-05-21 ENCOUNTER — Encounter: Admit: 2020-05-21 | Payer: PRIVATE HEALTH INSURANCE | Attending: Neurological Surgery | Primary: Urgent Care

## 2020-05-21 NOTE — Telephone Encounter
Received fax from [place] indicating they can not fulfil referral we sent them for patient to have driving evaluation. They do not perform these kinds of evaluations. They recommended [place]. Referral printed and faxed to [facility] in [place] [phone] with confirmation.

## 2020-05-25 ENCOUNTER — Encounter: Admit: 2020-05-25 | Payer: PRIVATE HEALTH INSURANCE | Attending: Neurology | Primary: Urgent Care

## 2020-05-27 ENCOUNTER — Encounter: Admit: 2020-05-27 | Payer: PRIVATE HEALTH INSURANCE | Attending: Neurological Surgery | Primary: Urgent Care

## 2020-05-27 DIAGNOSIS — I779 Disorder of arteries and arterioles, unspecified: Secondary | ICD-10-CM

## 2020-05-27 NOTE — Progress Notes
TELEPHONE VISIT: For this visit the clinician and patient were present via telephone (audio only).Patient counseled on available options for visit type; Patient elected telephone visit; Patient consent given for telephone visit: YesPatient Identity was confirmed during this call.? Other individuals actively participating in the telephone encounter and their name/relation to the patient: noneTotal time spent in medical telephone consultation: 10 minutesBecause this visit was completed over telephone, a hands-on physical exam was not performed.? Patient understands and knows to call back if condition changes. The visit type for this patient required modifications due to the COVID-19 outbreak.--------------------------------Thank you for referring Derek Hudson to my neurovascular clinic in follow-up after a R-sided CAROTID ENDARTERECTOMY (CEA). Derek Hudson participated today via a TeleHealth Telephone Visit. Briefly, this 64 year old man presented with bi-hemispjheric strokes after recent spine surgery. A [place] angiogram demonstrated a symptomatic, high-grade right carotid stenosis and contralateral (left) carotid occlusion.?He was referred by Vascular Neurology for right-sided carotid revascularization for secondary stroke prevention. He underwent a R-sided CEA on 16-Jan-2020. His surgery was uneventful.On direct questioning, Derek Hudson continues to make a good clinical recovery. He has persistent lower extremity and R-sided weakness, but is now ambulating with a cane. He has not had a recurrent stroke or stroke-like symptom. Imaging:  I have personally reviewed a carotid ultrasound performed 05/21/2020 that demonstrates (1) a widely patent R carotid and (2) persistent L carotid occlusion.Impression/Plan:  Overall, I am pleased with his clinico-radiographic progress-to-date. I will arrange for him to have a repeat carotid ultrasound in January 2022. He is agreeable with this plan moving forward. Thank you for entrusting me with the care of your patient. Please do not hesitate to contact me directly with any questions or concerns. Sincerely,CharlesCharles Emmory Solivan MD FRCS(C)Vice Chair [facility]), Department of NeurosurgerySection Chief of Firefighter, NPH and Disorders of CSF Dynamics ProgramYale University / [facility]

## 2020-06-12 ENCOUNTER — Encounter: Admit: 2020-06-12 | Payer: PRIVATE HEALTH INSURANCE | Attending: Neurological Surgery | Primary: Urgent Care

## 2020-07-17 ENCOUNTER — Telehealth: Admit: 2020-07-17 | Payer: PRIVATE HEALTH INSURANCE | Attending: Neurological Surgery | Primary: Urgent Care

## 2020-07-17 ENCOUNTER — Encounter: Admit: 2020-07-17 | Payer: PRIVATE HEALTH INSURANCE | Attending: Neurology | Primary: Urgent Care

## 2020-07-17 NOTE — Telephone Encounter
Returned call to pt. He would like to speak to Dr Sarita Haver about failing his [place] driving test and what kind of therapy would be helpful.  He wants in person visit only. Pt scheduled for clinic visit 8/4. Pt verbalized understanding. I encouraged him to call with any further questions or concerns.

## 2020-07-17 NOTE — Telephone Encounter
Pt called in stating that it is very important that he speak with Dr. Sarita Haver. Please advise Mr. Pulis Number: [phone]

## 2020-07-20 MED ORDER — MUPIROCIN 2 % TOPICAL OINTMENT
2 | Freq: Three times a day (TID) | TOPICAL | 3 refills | 7.00000 days | Status: AC
Start: 2020-07-20 — End: 2020-10-05

## 2020-07-20 MED ORDER — NORTRIPTYLINE 25 MG CAPSULE
25 | ORAL_CAPSULE | Freq: Every evening | ORAL | 1 refills | 90.00000 days | Status: AC
Start: 2020-07-20 — End: 2020-09-23

## 2020-07-20 MED ORDER — NICOTINE 21 MG/24 HR DAILY TRANSDERMAL PATCH
21 | MEDICATED_PATCH | Freq: Every day | TRANSDERMAL | 3 refills | 28.00000 days | Status: AC
Start: 2020-07-20 — End: 2020-10-01

## 2020-07-20 MED ORDER — NICOTINE (POLACRILEX) 2 MG BUCCAL LOZENGE
2 | BUCCAL | 3 refills | 28.00000 days | Status: AC | PRN
Start: 2020-07-20 — End: 2020-10-01

## 2020-07-20 MED ORDER — FUROSEMIDE 20 MG TABLET
20 | ORAL | 2.00 refills | 70.00000 days | Status: AC
Start: 2020-07-20 — End: 2020-10-01

## 2020-07-21 MED ORDER — METOPROLOL SUCCINATE ER 100 MG TABLET,EXTENDED RELEASE 24 HR
100 | ORAL_TABLET | Freq: Every morning | ORAL | 4 refills | 90.00000 days | Status: AC
Start: 2020-07-21 — End: 2021-06-01

## 2020-07-22 ENCOUNTER — Encounter: Admit: 2020-07-22 | Payer: PRIVATE HEALTH INSURANCE | Attending: Neurological Surgery | Primary: Urgent Care

## 2020-07-22 ENCOUNTER — Ambulatory Visit: Admit: 2020-07-22 | Payer: PRIVATE HEALTH INSURANCE | Attending: Neurological Surgery | Primary: Urgent Care

## 2020-07-22 DIAGNOSIS — K635 Polyp of colon: Secondary | ICD-10-CM

## 2020-07-22 DIAGNOSIS — M542 Cervicalgia: Secondary | ICD-10-CM

## 2020-07-22 DIAGNOSIS — Z9889 Other specified postprocedural states: Secondary | ICD-10-CM

## 2020-07-22 DIAGNOSIS — R519 Headache: Secondary | ICD-10-CM

## 2020-07-22 DIAGNOSIS — M545 Lumbar pain: Secondary | ICD-10-CM

## 2020-07-22 DIAGNOSIS — E7800 Pure hypercholesterolemia, unspecified: Secondary | ICD-10-CM

## 2020-07-22 DIAGNOSIS — I509 Heart failure, unspecified: Secondary | ICD-10-CM

## 2020-07-22 DIAGNOSIS — I219 Acute myocardial infarction, unspecified: Secondary | ICD-10-CM

## 2020-07-22 DIAGNOSIS — F17200 Nicotine dependence, unspecified, uncomplicated: Secondary | ICD-10-CM

## 2020-07-22 DIAGNOSIS — I10 Essential (primary) hypertension: Secondary | ICD-10-CM

## 2020-07-22 DIAGNOSIS — I639 Cerebral infarction, unspecified: Secondary | ICD-10-CM

## 2020-07-22 DIAGNOSIS — I5022 Chronic systolic (congestive) heart failure: Secondary | ICD-10-CM

## 2020-07-22 DIAGNOSIS — I63231 Cerebral infarction due to unspecified occlusion or stenosis of right carotid arteries: Secondary | ICD-10-CM

## 2020-07-22 DIAGNOSIS — M1990 Unspecified osteoarthritis, unspecified site: Secondary | ICD-10-CM

## 2020-07-22 NOTE — Progress Notes
[name] of Birth: October 07, 1957History of Present Illness:Thank you for referring Quron Ruddy to my neurovascular clinic in follow-up after a R-sided CAROTID ENDARTERECTOMY (CEA). Mani arrived today in clinic unaccompanied. He arrived ambulating with a 4-point walker.?Briefly, this 64 year old man presented with bi-hemispjheric strokes after recent spine surgery. A [place] angiogram demonstrated a symptomatic, high-grade right carotid stenosis and contralateral (left) carotid occlusion.?He was referred by Vascular Neurology for?right-sided?carotid revascularization?for secondary stroke prevention. He underwent a R-sided CEA on 16-Jan-2020. His surgery was uneventful. Arik returns today to discuss the result of [place] driving test. He failed the test and was told that his response time was too slow.?On direct questioning, Darylene Price continues to make a good clinical recovery. He has persistent lower extremity and R-sided weakness, but is ambulating better. He has not had a recurrent stroke or stroke-like symptom. Current Outpatient Medications: ?  aspirin, 324 mg, Oral, Daily (Patient taking differently: 81 mg, Oral, Daily)?  atorvastatin, TAKE 1 TABLET BY MOUTH EVERY DAY?  chlorhexidine gluconate, Apply 1 Application topically.?  cyclobenzaprine, TAKE 1 TABLET BY MOUTH TWICE A DAY AS NEEDED FOR MUSCLE SPASMS?  furosemide, ?  ibuprofen, TAKE 1 TABLET BY MOUTH TWICE A DAY AS NEEDED?  Jublia, 1 Application, Topical (Top), Daily (Patient not taking: Reported on 05/15/2020)?  lisinopriL, TAKE 1 TABLET BY MOUTH EVERY DAY?  metoprolol succinate XL, 100 mg, Oral, QAM?  mupirocin, Apply topically 3 (three) times daily for 7 days.?  nicotine, 1 patch, Transdermal, Daily?  nicotine polacrilex, 2 mg, Buccal, Q2H PRN?  nortriptyline, 125 mg, Oral, Nightly?  oxyCODONE-acetaminophen, 1 tablet, Oral, Q6H PRNAllergies: He has No Known Allergies.Review of SystemsI have reviewed the review of systems, as noted by the clinical support staff.BP (!) 152/93 (Site: l a, Position: Sitting, Cuff Size: Large)  - Pulse (!) 105  - Temp 97.5 ?F (36.4 ?C) (Temporal)  - Ht 5' 9.5 (1.765 m)  - Wt 94.7 kg  - SpO2 98%  - BMI 30.39 kg/m?  AO x 3, PERRL, EOMI, he moves all four extremities with mild R sided weakness. He ambulates sufficiently with a walker with mild dragging of his R foot. Imaging: There is no new imaging for this visit.Assessment/Plan: Overall, I am pleased with his clinico-radiographic progress-to-date. We discussed trialing PT?OT and retaking the test in 6 months. He is agreeable with this plan moving forward.Thank you for entrusting me with the care of your patient. Please do not hesitate to contact me directly with any questions or concerns.  Sincerely, Evans Lance MD FRCS(C)Vice Chair [facility]), Department of NeurosurgerySection Chief of Firefighter, NPH and Disorders of CSF Dynamics ProgramYale University / [facility] HospitalElectronically signed ZO:[id number] Ina Kick, MD

## 2020-07-27 ENCOUNTER — Encounter: Admit: 2020-07-27 | Payer: PRIVATE HEALTH INSURANCE | Attending: Neurology | Primary: Urgent Care

## 2020-08-06 MED ORDER — ATORVASTATIN 20 MG TABLET
20 | ORAL_TABLET | Freq: Every day | ORAL | 1 refills | 90.00000 days | Status: AC
Start: 2020-08-06 — End: 2020-10-27

## 2020-09-17 ENCOUNTER — Encounter: Admit: 2020-09-17 | Payer: PRIVATE HEALTH INSURANCE | Attending: Neurology | Primary: Urgent Care

## 2020-09-23 MED ORDER — NORTRIPTYLINE 25 MG CAPSULE
25 | ORAL_CAPSULE | Freq: Every evening | ORAL | 1 refills | 90.00000 days | Status: AC
Start: 2020-09-23 — End: 2021-01-20

## 2020-10-01 MED ORDER — SILDENAFIL 100 MG TABLET
100 | ORAL_TABLET | Freq: Every day | ORAL | 3 refills | 30.00000 days | Status: AC | PRN
Start: 2020-10-01 — End: ?

## 2020-10-05 MED ORDER — MUPIROCIN 2 % TOPICAL OINTMENT
2 | Freq: Three times a day (TID) | TOPICAL | 3 refills | 7.00000 days | Status: AC
Start: 2020-10-05 — End: ?

## 2020-10-08 MED ORDER — LISINOPRIL 10 MG TABLET
10 | ORAL_TABLET | Freq: Every day | ORAL | 4 refills | 90.00000 days | Status: AC
Start: 2020-10-08 — End: 2021-10-29

## 2020-10-27 MED ORDER — ATORVASTATIN 20 MG TABLET
20 | ORAL_TABLET | ORAL | 1 refills | 90.00000 days | Status: AC
Start: 2020-10-27 — End: 2021-01-20

## 2020-12-02 ENCOUNTER — Encounter: Admit: 2020-12-02 | Payer: PRIVATE HEALTH INSURANCE | Attending: Neurological Surgery | Primary: Urgent Care

## 2020-12-02 DIAGNOSIS — I6521 Occlusion and stenosis of right carotid artery: Secondary | ICD-10-CM

## 2021-01-14 ENCOUNTER — Telehealth: Admit: 2021-01-14 | Payer: PRIVATE HEALTH INSURANCE | Attending: Neurological Surgery | Primary: Urgent Care

## 2021-01-14 NOTE — Telephone Encounter
Pt on jan recall Pt due for carotid u/s Lm to call tammy

## 2021-01-20 MED ORDER — NORTRIPTYLINE 25 MG CAPSULE
25 | ORAL_CAPSULE | Freq: Every evening | ORAL | 1 refills | 90.00000 days | Status: AC
Start: 2021-01-20 — End: 2021-02-11

## 2021-01-20 MED ORDER — ATORVASTATIN 20 MG TABLET
20 | ORAL_TABLET | Freq: Every day | ORAL | 1 refills | 90.00000 days | Status: AC
Start: 2021-01-20 — End: 2021-04-20

## 2021-02-11 MED ORDER — NORTRIPTYLINE 25 MG CAPSULE
25 | ORAL_CAPSULE | Freq: Every evening | ORAL | 1 refills | 90.00000 days | Status: AC
Start: 2021-02-11 — End: 2021-04-15

## 2021-02-17 ENCOUNTER — Inpatient Hospital Stay: Admit: 2021-02-17 | Discharge: 2021-02-17 | Payer: PRIVATE HEALTH INSURANCE | Primary: Urgent Care

## 2021-02-17 DIAGNOSIS — I6521 Occlusion and stenosis of right carotid artery: Secondary | ICD-10-CM

## 2021-02-25 ENCOUNTER — Telehealth: Admit: 2021-02-25 | Payer: PRIVATE HEALTH INSURANCE | Attending: Neurological Surgery | Primary: Urgent Care

## 2021-02-25 DIAGNOSIS — I6521 Occlusion and stenosis of right carotid artery: Secondary | ICD-10-CM

## 2021-02-25 DIAGNOSIS — I779 Disorder of arteries and arterioles, unspecified: Secondary | ICD-10-CM

## 2021-02-25 MED ORDER — ASPIRIN 81 MG CHEWABLE TABLET
81 mg | ORAL_TABLET | Freq: Every day | ORAL | 4 refills | Status: AC
Start: 2021-02-25 — End: ?

## 2021-02-25 NOTE — Telephone Encounter
I reviewed carotid ultrasound with Dr. Sarita Haver- increase in plaque noted on imaging but stable velocities. Discussed results with patient- he reports he has stopped asa 81mg , will send in refill. Confirmed that he remain on statin and antihypertensives.

## 2021-04-15 MED ORDER — NORTRIPTYLINE 25 MG CAPSULE
25 | ORAL_CAPSULE | Freq: Every evening | ORAL | 1 refills | 90.00000 days | Status: AC
Start: 2021-04-15 — End: ?

## 2021-04-20 MED ORDER — ATORVASTATIN 20 MG TABLET
20 | ORAL_TABLET | Freq: Every day | ORAL | 2 refills | 90.00000 days | Status: AC
Start: 2021-04-20 — End: ?

## 2021-04-30 MED ORDER — MISCELLANEOUS MEDICAL SUPPLY MISC
1 refills | 16.00000 days | Status: AC
Start: 2021-04-30 — End: ?

## 2021-06-01 MED ORDER — METOPROLOL SUCCINATE ER 100 MG TABLET,EXTENDED RELEASE 24 HR
100 | ORAL_TABLET | Freq: Every morning | ORAL | 1 refills | 90.00000 days | Status: AC
Start: 2021-06-01 — End: 2021-07-09

## 2021-07-09 MED ORDER — METOPROLOL SUCCINATE ER 100 MG TABLET,EXTENDED RELEASE 24 HR
100 | ORAL_TABLET | ORAL | 1 refills | 90.00000 days | Status: AC
Start: 2021-07-09 — End: 2021-10-29

## 2021-07-10 ENCOUNTER — Encounter: Admit: 2021-07-10 | Payer: PRIVATE HEALTH INSURANCE | Attending: Neurological Surgery | Primary: Urgent Care

## 2021-07-10 DIAGNOSIS — I6521 Occlusion and stenosis of right carotid artery: Secondary | ICD-10-CM

## 2021-07-10 DIAGNOSIS — I6522 Occlusion and stenosis of left carotid artery: Secondary | ICD-10-CM

## 2021-07-10 DIAGNOSIS — Z9889 Other specified postprocedural states: Secondary | ICD-10-CM

## 2021-08-11 ENCOUNTER — Telehealth: Admit: 2021-08-11 | Payer: PRIVATE HEALTH INSURANCE | Attending: Neurological Surgery | Primary: Urgent Care

## 2021-08-11 NOTE — Telephone Encounter
[facility] MESSAGETime of call:   11:02 AMCaller:   MichaelCaller's relationship to patient:  self  Calling from (pharmacy, hospital, agency, etc.):  n/a   Reason for call:   Patient calling asking for a new order for a Ultrasound be sent over to [facility] in [place] Patient gave phone number [phone] Fax# [phone] Please Advise.If not feeling well, what are symptoms:  n/a   If having symptoms, how long have the symptoms been present:  n/a   Does caller request to speak to someone urgently?  no   If yes, warm transferred to:  Nurse - name: n/aBest telephone number for callback:   [phone] Best time to return call:   Any Permission to leave message:  yes   [place] Sp[facility] [place] Referral Specialist

## 2021-09-08 ENCOUNTER — Telehealth: Admit: 2021-09-08 | Payer: PRIVATE HEALTH INSURANCE | Attending: Neurological Surgery | Primary: Urgent Care

## 2021-09-08 NOTE — Telephone Encounter
Please call patient- CUS from August demonstrates no hemodynamically significant stenosis. Will plan for repeat CUS in 1 year

## 2021-09-08 NOTE — Telephone Encounter
I called patient with the results of their most recent carotid ultrasound. Dr. Sarita Haver reviewed imaging personally and it shows stable stenosis.  Dr Sarita Haver would like to repeat the ultrasound in 1 year. I advised patient to call the office with any questions or concerns.

## 2021-09-08 NOTE — Telephone Encounter
Patient returning call from nurse Cristal Deer regarding his Results Patient ask for a call back at [phone]

## 2021-09-09 NOTE — Telephone Encounter
Left VM

## 2021-10-29 MED ORDER — METOPROLOL SUCCINATE ER 100 MG TABLET,EXTENDED RELEASE 24 HR
100 | ORAL_TABLET | ORAL | 1 refills | 90.00000 days | Status: AC
Start: 2021-10-29 — End: 2022-02-24

## 2021-10-29 MED ORDER — LISINOPRIL 10 MG TABLET
10 | ORAL_TABLET | ORAL | 4 refills | 90.00000 days | Status: AC
Start: 2021-10-29 — End: ?

## 2022-02-11 ENCOUNTER — Encounter: Admit: 2022-02-11 | Payer: PRIVATE HEALTH INSURANCE | Attending: Urgent Care

## 2022-02-21 ENCOUNTER — Encounter: Admit: 2022-02-21 | Payer: PRIVATE HEALTH INSURANCE

## 2022-02-24 MED ORDER — METOPROLOL SUCCINATE ER 100 MG TABLET,EXTENDED RELEASE 24 HR
100 | ORAL_TABLET | ORAL | 1 refills | 90.00000 days | Status: AC
Start: 2022-02-24 — End: ?

## 2022-04-06 ENCOUNTER — Encounter: Admit: 2022-04-06 | Payer: PRIVATE HEALTH INSURANCE | Attending: Urgent Care

## 2022-04-26 ENCOUNTER — Telehealth: Admit: 2022-04-26 | Payer: PRIVATE HEALTH INSURANCE | Attending: Urgent Care

## 2022-05-18 ENCOUNTER — Encounter: Admit: 2022-05-18 | Payer: PRIVATE HEALTH INSURANCE | Attending: Neurological Surgery | Primary: Urgent Care

## 2022-05-18 DIAGNOSIS — I6521 Occlusion and stenosis of right carotid artery: Secondary | ICD-10-CM

## 2022-05-18 DIAGNOSIS — Z9889 Other specified postprocedural states: Secondary | ICD-10-CM

## 2022-05-18 DIAGNOSIS — I6522 Occlusion and stenosis of left carotid artery: Secondary | ICD-10-CM

## 2022-05-24 ENCOUNTER — Telehealth: Admit: 2022-05-24 | Payer: PRIVATE HEALTH INSURANCE | Attending: Neurological Surgery | Primary: Urgent Care

## 2022-05-24 NOTE — Telephone Encounter
Aug recall Carotid Lm to call tammy

## 2022-05-30 NOTE — Telephone Encounter
SPOKE W PT [place]OV[place] TO [place] 2 WEEKS [place]  U/s   Aug  / sept I gave him medical records phone number and told him what he needed for his new pcp He will MD in [facility] order u/s and send [place] a copy of the report so we can also review for him

## 2022-05-30 NOTE — Telephone Encounter
Aug recall Lm to call Derek Hudson

## 2022-10-31 ENCOUNTER — Encounter: Admit: 2022-10-31 | Payer: PRIVATE HEALTH INSURANCE

## 2023-02-20 ENCOUNTER — Encounter: Admit: 2023-02-20 | Payer: PRIVATE HEALTH INSURANCE | Attending: Urgent Care

## 2023-08-01 IMAGING — MR MRI LUMBAR SPINE WITHOUT CONTRAST
4 series · 48 of 48 positions shown · IV contrast (Off)
Comparison: none

MRI OF THE LUMBAR SPINE WITHOUT CONTRAST
CLINICAL HISTORY: Chronic low back pain, left leg pain, bilateral radiculopathy.
TECHNIQUE: Multisequential multiplanar imaging was performed of the lumbar spinal region.

[Series 1: z s-c scano · coronal · 6.0mm · 1.17mm/px · 4 of 6 slices shown]
[im 1/6]
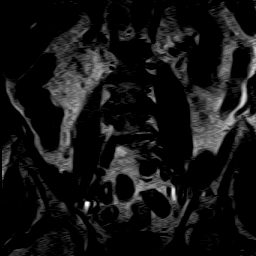
[im 2/6]
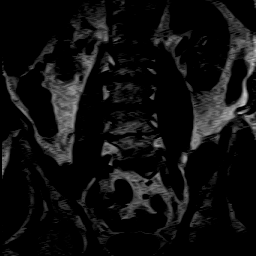
[im 4/6]
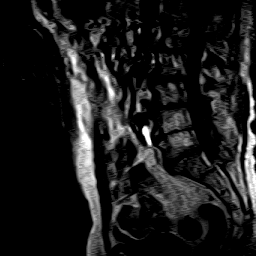
[im 6/6]
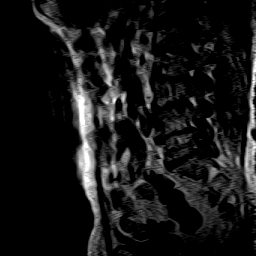

[Series 2: T2 · sagittal · 5.0mm · 1.13mm/px · 11 of 13 slices shown (1 of 2)]
[im 1/13]
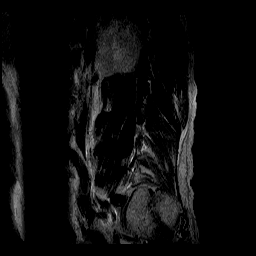
[im 2/13]
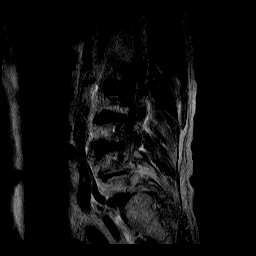
[im 3/13]
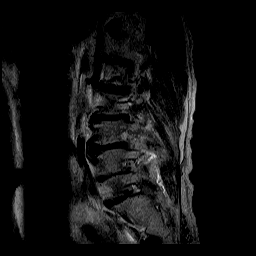
[im 4/13]
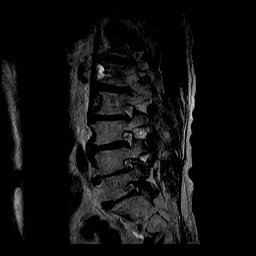
[im 5/13]
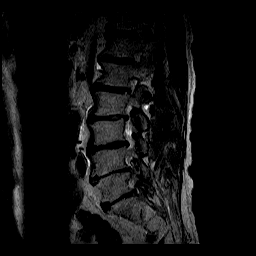
[im 7/13]
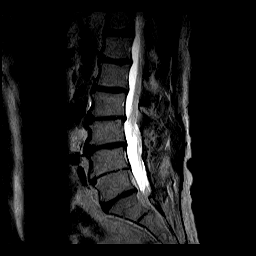
[im 8/13]
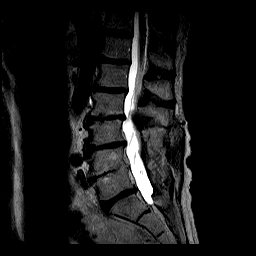
[im 9/13]
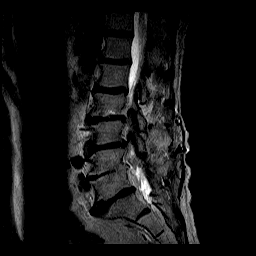
[im 10/13]
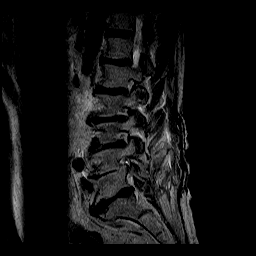
[im 11/13]
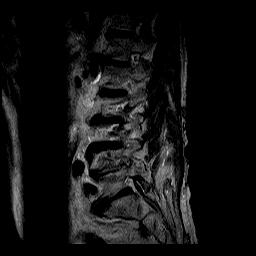
[im 13/13]
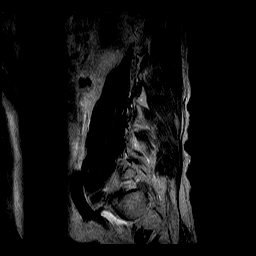

[Series 3: T1 · sagittal · 5.0mm · 1.13mm/px · 11 of 13 slices shown]
[im 1/13]
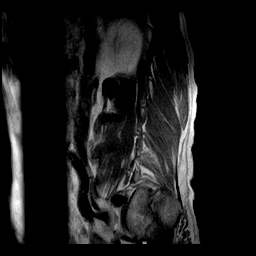
[im 2/13]
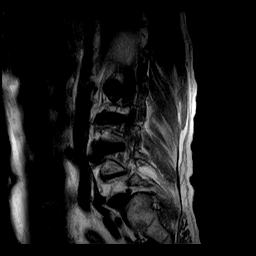
[im 3/13]
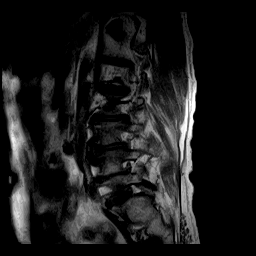
[im 4/13]
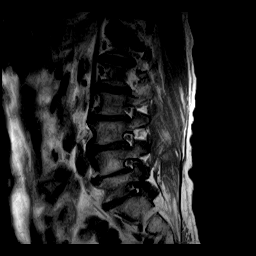
[im 5/13]
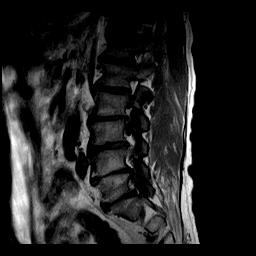
[im 7/13]
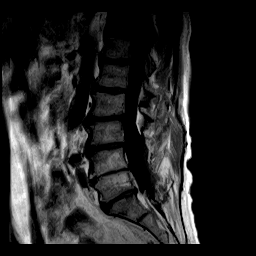
[im 8/13]
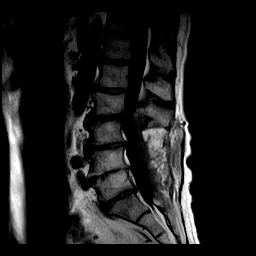
[im 9/13]
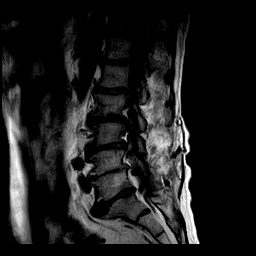
[im 10/13]
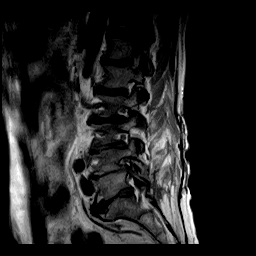
[im 11/13]
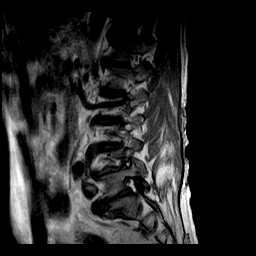
[im 13/13]
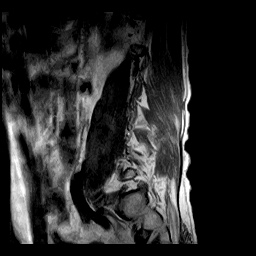

[Series 5: T2 · axial · 4.0mm · 1.17mm/px · z∈[-77,+98]mm · 22 of 26 slices shown (2 of 2)]
[im 1/26]
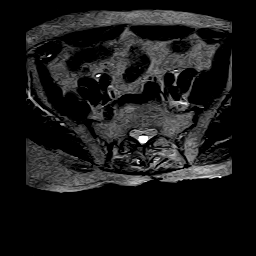
[im 2/26]
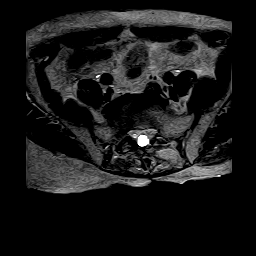
[im 3/26]
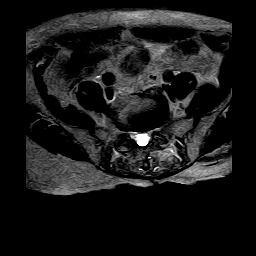
[im 4/26]
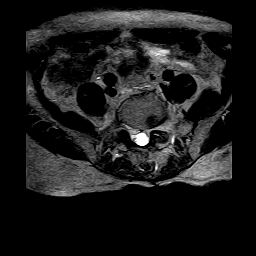
[im 5/26]
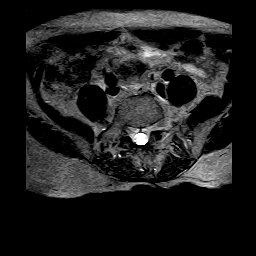
[im 6/26]
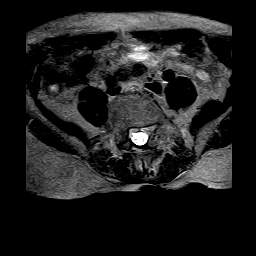
[im 8/26]
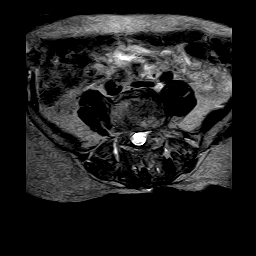
[im 9/26]
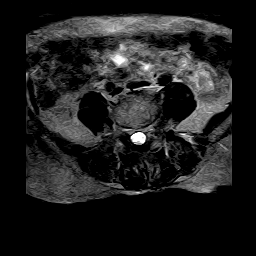
[im 10/26]
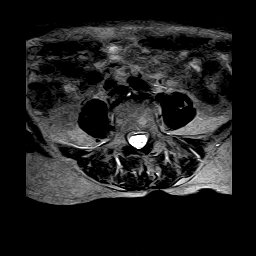
[im 11/26]
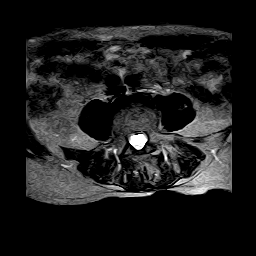
[im 12/26]
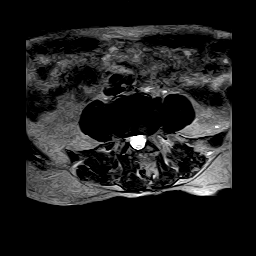
[im 14/26]
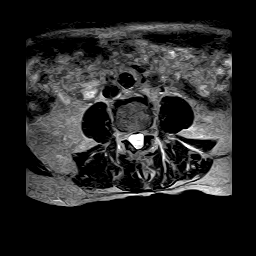
[im 15/26]
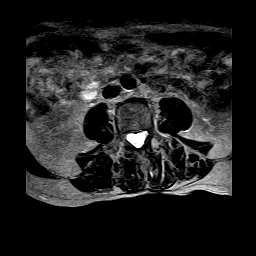
[im 16/26]
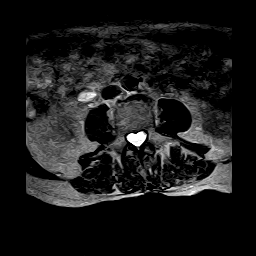
[im 17/26]
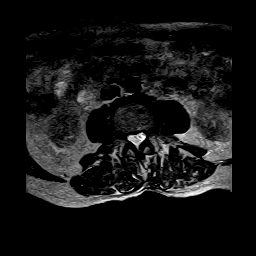
[im 18/26]
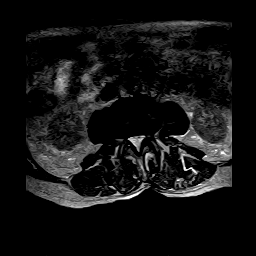
[im 20/26]
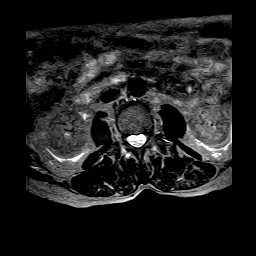
[im 21/26]
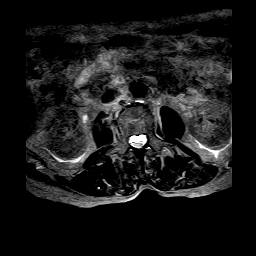
[im 22/26]
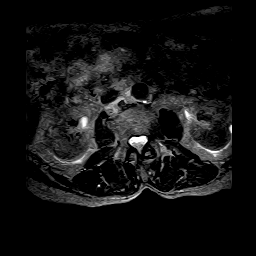
[im 23/26]
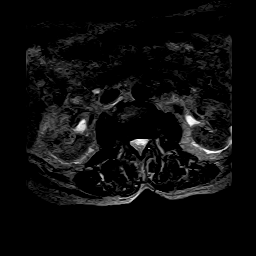
[im 24/26]
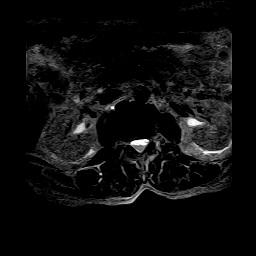
[im 26/26]
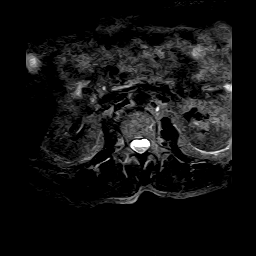

[48 of 48 positions shown; findings below may reference images not displayed]

FINDINGS: There is a normal marrow signal noted throughout the lumbar vertebral bodies. The conus medullaris is unremarkable and there is no obvious intradural abnormality noted. There is moderate facet arthrosis and ligamentum flavum hypertrophy throughout the lumbar spinal region. There is evidence of previous surgery with laminectomies at L3-L4, L4-L5, and L5-S1. 

At T12-L1 level, there is narrowing, anterior, posterior, and lateral osteophyte, and 1 to 1.5 mm bulging. 

At L1-L2 level, there is anterior, posterior, and lateral osteophyte, 1 to 1.5 mm retrolisthesis, and severe bilateral neural foraminal narrowing. 

At L2-L3 level, there is narrowing, anterior, posterior, and lateral osteophyte, 1 to 1.5 mm bulging, and severe bilateral neural foraminal narrowing. 

At L3-L4 level, there is narrowing, anterior, posterior, and lateral osteophyte, 1 to 1.5 mm retrolisthesis, and 1 to 1.5 mm bulging. Mild left neural foraminal narrowing. Surgery as noted.

At L4-L5 level, there is narrowing, anterior, posterior, and lateral osteophyte, and mild to moderate bilateral neural foraminal narrowing. Surgery as noted. 

At L5-S1 level, there is narrowing, anterior, posterior, and lateral osteophyte, and mild to moderate left neural foraminal narrowing. Surgery as noted.
IMPRESSION: 1.
Narrowing and anterior, posterior, and lateral osteophyte at T12-L1, L2-L3, L3-L4, L4-L5, and L5-S1 with anterior, posterior, and lateral osteophyte at L1-L2. There is evidence of previous surgery with laminectomies at L3-L4, L4-L5, and L5-S1 levels. 1 to 1.5 mm bulging at T12-L1, L2-L3, and L3-L4 levels. There is moderate facet arthrosis and ligamentum flavum hypertrophy throughout but no true spinal stenosis.

2.
Lateral osteophyte and facet arthrosis are causing severe bilateral neural foraminal narrowing at L2-L3 and L2-L3, mild left neural foraminal narrowing at L3-L4, mild to moderate bilateral neural foraminal narrowing at L4-L5, and mild to moderate left neural foraminal narrowing at L5-S1.

3.
1 to 1.5 mm retrolisthesis of L1-L2 and L3-L4 levels in neutral position.

## 2023-08-01 IMAGING — MR MRI CERVICAL SPINE WITHOUT CONTRAST
4 series · 48 of 48 positions shown · IV contrast (Off)
Comparison: none

MRI OF THE CERVICAL SPINE WITHOUT CONTRAST
CLINICAL HISTORY: Chronic neck pain, left radiculopathy.
TECHNIQUE: Multisequential multiplanar imaging was performed of the cervical spinal region.

[Series 1: z scano sag/cor · coronal · 6.0mm · 1.02mm/px · 4 of 6 slices shown]
[im 1/6]
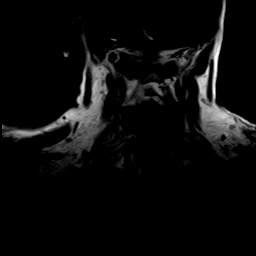
[im 2/6]
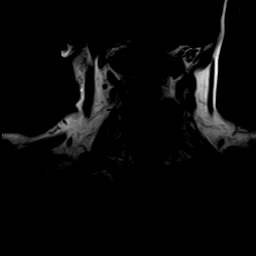
[im 4/6]
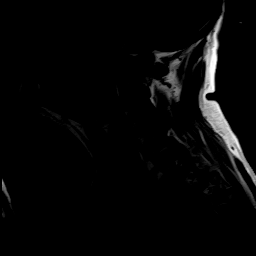
[im 6/6]
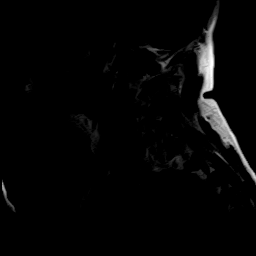

[Series 2: T2 · sagittal · 3.0mm · 0.94mm/px · 11 of 13 slices shown]
[im 1/13]
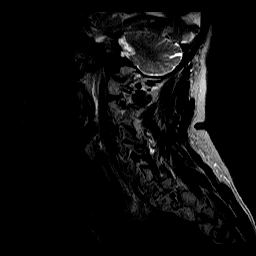
[im 2/13]
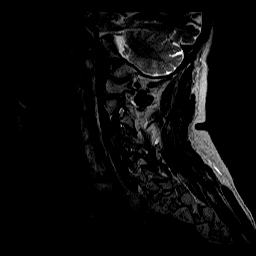
[im 3/13]
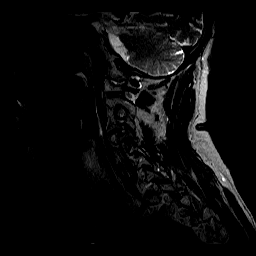
[im 4/13]
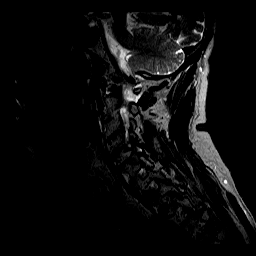
[im 5/13]
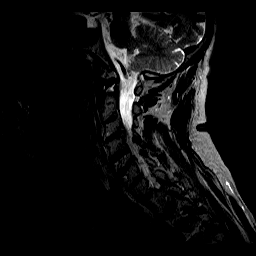
[im 7/13]
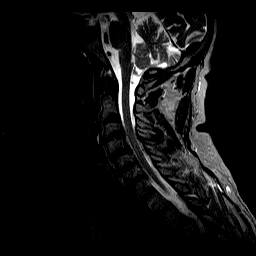
[im 8/13]
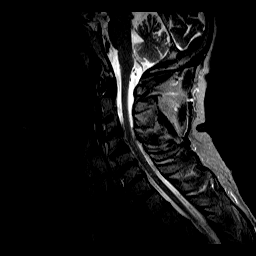
[im 9/13]
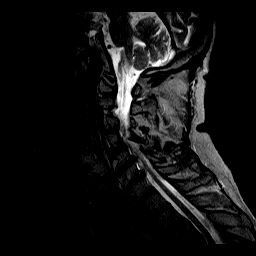
[im 10/13]
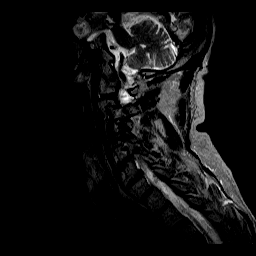
[im 11/13]
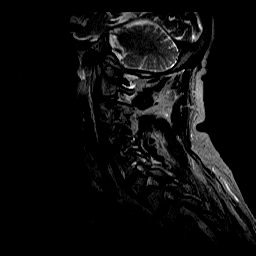
[im 13/13]
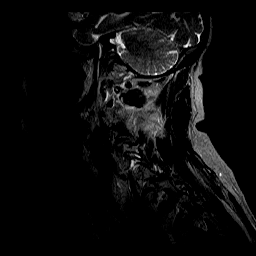

[Series 3: T1 · sagittal · 3.0mm · 0.94mm/px · 11 of 13 slices shown]
[im 1/13]
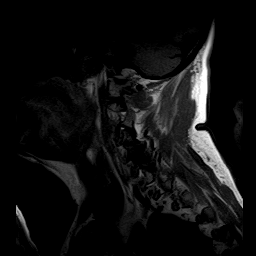
[im 2/13]
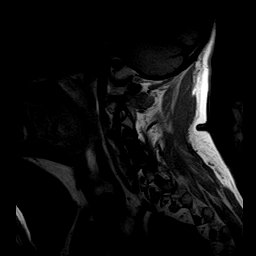
[im 3/13]
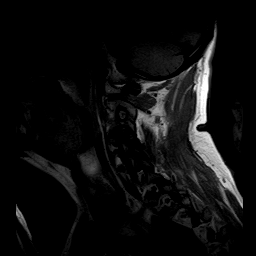
[im 4/13]
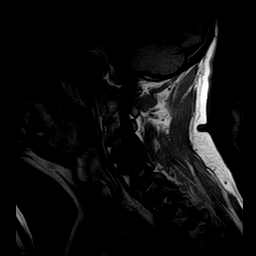
[im 5/13]
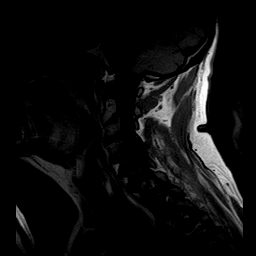
[im 7/13]
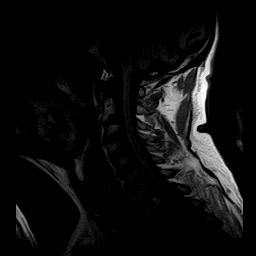
[im 8/13]
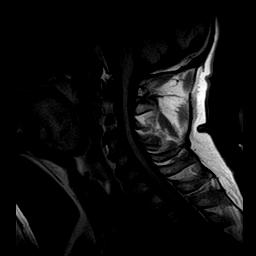
[im 9/13]
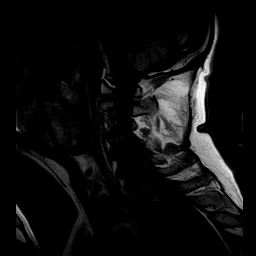
[im 10/13]
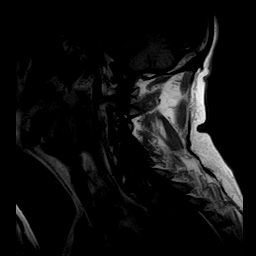
[im 11/13]
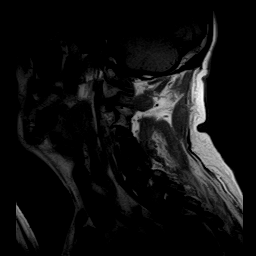
[im 13/13]
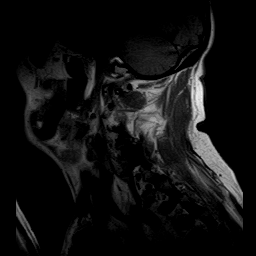

[Series 5: gr (person_name)/ mtc · axial · 3.0mm · 0.94mm/px · z∈[-91,-3]mm · 22 of 26 slices shown]
[im 1/26]
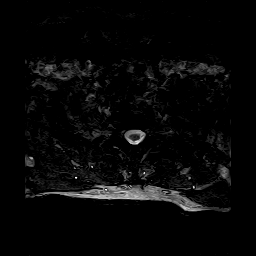
[im 2/26]
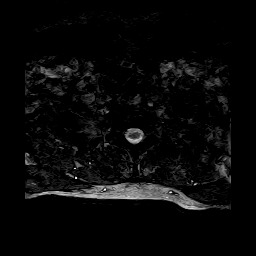
[im 3/26]
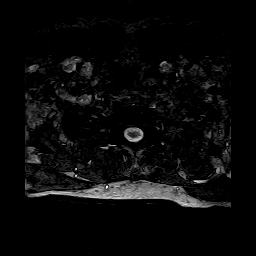
[im 4/26]
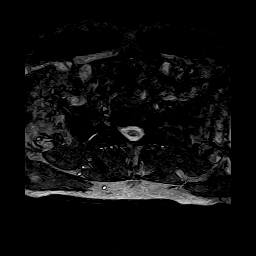
[im 5/26]
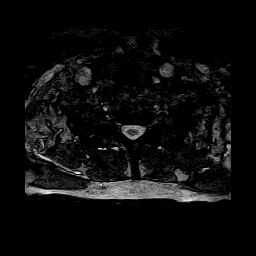
[im 6/26]
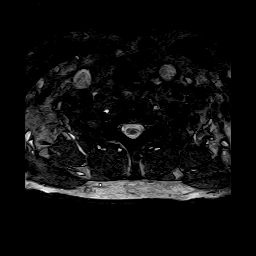
[im 8/26]
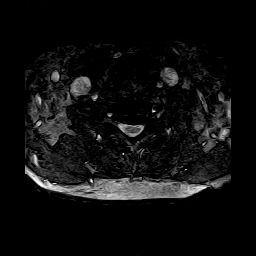
[im 9/26]
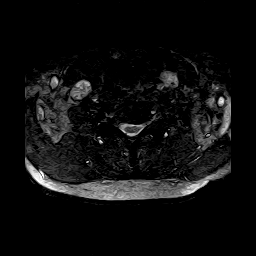
[im 10/26]
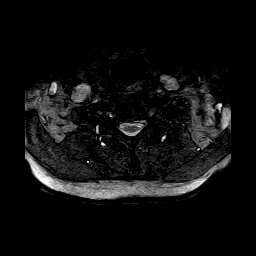
[im 11/26]
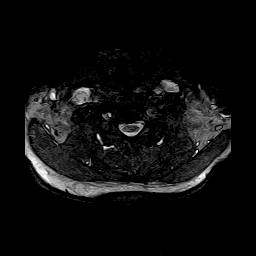
[im 12/26]
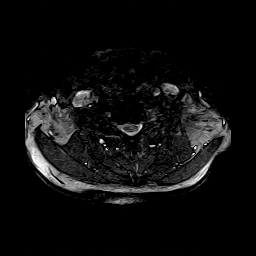
[im 14/26]
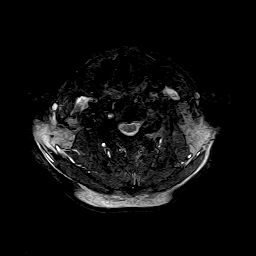
[im 15/26]
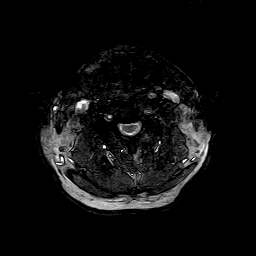
[im 16/26]
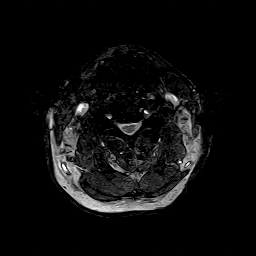
[im 17/26]
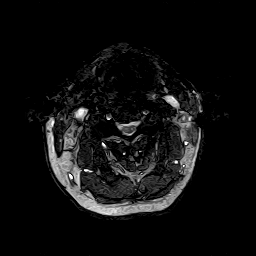
[im 18/26]
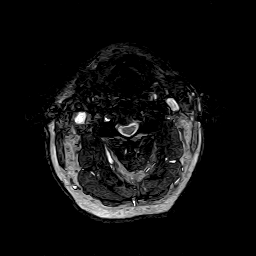
[im 20/26]
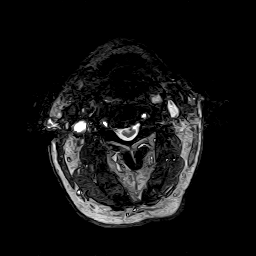
[im 21/26]
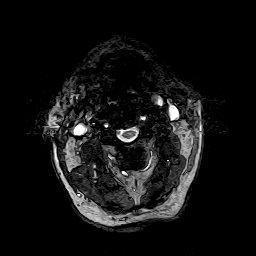
[im 22/26]
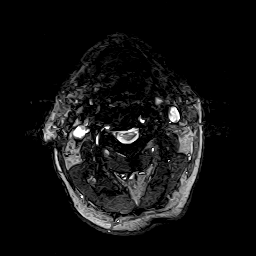
[im 23/26]
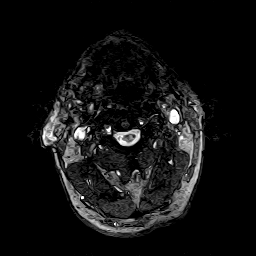
[im 24/26]
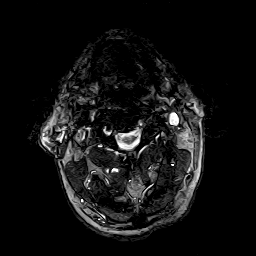
[im 26/26]
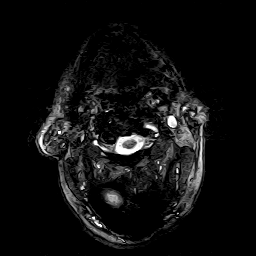

[48 of 48 positions shown; findings below may reference images not displayed]

FINDINGS: Normal marrow signal is noted throughout the cervical vertebral bodies. There is no abnormal signal identified of the cervical spinal cord. There is straightening of the cervical lordotic curvature. There is narrowing and anterior, posterior, and lateral osteophyte at C3-C4, C4-C5, C5-C6, and C6-C7 levels. 

At C2-C3 level, there is 1 mm anterolisthesis.

At C3-C4 level, there is 1 to 1.5 mm bulging and severe right neural foraminal narrowing. 

At C4-C5 level, there is 2.5 to 3 mm anterolisthesis and 1 mm bulging. Severe bilateral neural foraminal narrowing. 

At C5-C6 level, there is 1 to 1.5 mm anterolisthesis and severe bilateral neural foraminal narrowing. 

At C6-C7 level, there is 1 to 1.5 mm bulging. All findings asymmetric toward the left side. Moderate to severe left neural foraminal narrowing. 

At C7-T1 level, there is no evidence of disc herniation, neural foraminal narrowing, or spinal stenosis.
IMPRESSION: 1.
Narrowing and anterior, posterior, and lateral osteophyte at C3-C4, C4-C5, C5-C6, and C6-C7. 1 to 1.5 mm bulging at C3-C4, 1 mm bulging at C4-C5, and 1 to 1.5 mm bulging with findings asymmetric toward the left side at C6-C7. No significant cord effacement or spinal stenosis. 

2.
Straightening of the cervical lordotic curvature may be secondary to positioning vs. muscle spasm. There is 1 mm anterolisthesis of C2 on C3, 2.5 to 3 mm anterolisthesis of C4 on C5, and 1 to 1.5 mm anterolisthesis of C5 on C6 in neutral position, which may represent some laxity of the posterior longitudinal ligament. 

3.
Lateral osteophyte and uncovertebral hypertrophy are causing severe right neural foraminal narrowing at C3-C4, severe bilateral neural foraminal narrowing at C4-C5 and C5-C6, and moderate to severe left neural foraminal narrowing at C6-C7.
